# Patient Record
Sex: Male | Born: 1990 | Race: White | Hispanic: No | Marital: Single | Smoking: Never smoker
Health system: Southern US, Community
[De-identification: ages and names within clinical notes are randomized; demographics above are authoritative.]

## PROBLEM LIST (undated history)

## (undated) DIAGNOSIS — F909 Attention-deficit hyperactivity disorder, unspecified type: Secondary | ICD-10-CM

## (undated) DIAGNOSIS — F419 Anxiety disorder, unspecified: Secondary | ICD-10-CM

## (undated) DIAGNOSIS — F32A Depression, unspecified: Secondary | ICD-10-CM

## (undated) DIAGNOSIS — F329 Major depressive disorder, single episode, unspecified: Secondary | ICD-10-CM

## (undated) DIAGNOSIS — F319 Bipolar disorder, unspecified: Secondary | ICD-10-CM

## (undated) HISTORY — PX: ADENOIDECTOMY: SUR15

## (undated) HISTORY — PX: TONSILLECTOMY: SUR1361

---
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
## 2010-01-15 ENCOUNTER — Emergency Department (HOSPITAL_COMMUNITY)
Admission: EM | Admit: 2010-01-15 | Discharge: 2010-01-16 | Payer: Self-pay | Source: Home / Self Care | Admitting: Emergency Medicine

## 2010-02-11 ENCOUNTER — Encounter: Payer: Self-pay | Admitting: Preventative Medicine

## 2010-02-20 NOTE — Letter (Signed)
Summary: Unable to Reach, Consult Scheduled  [facility]  [address]   [place], [place] [address]   Phone: [phone]  Fax: [phone]    06/16/2009  [facility] [address] [place], [place]  [address] 04-25-1990   Dear Shane Patrick,   We have been unable to reach you by phone.  Please contact our office with an updated phone number.  At the recommendation of DR Effie Shy we have been asked to schedule you a consult with DR Jena Gauss for DYSPHAGIA.    Please call our office at [phone].     Thank you,    Diana Eves  [facility] R. Roetta Sessions, M.D.    Jonette Eva, M.D. Lorenza Burton, FNP-BC    Tana Coast, PA-C Phone: [phone]    Fax: [phone]

## 2010-02-24 ENCOUNTER — Emergency Department (HOSPITAL_COMMUNITY)
Admission: EM | Admit: 2010-02-24 | Discharge: 2010-02-24 | Disposition: A | Discharge status: Home / Self Care | Payer: Medicaid Other | Attending: Emergency Medicine | Admitting: Emergency Medicine

## 2010-02-24 DIAGNOSIS — L259 Unspecified contact dermatitis, unspecified cause: Secondary | ICD-10-CM | POA: Insufficient documentation

## 2010-02-24 DIAGNOSIS — K644 Residual hemorrhoidal skin tags: Secondary | ICD-10-CM | POA: Insufficient documentation

## 2010-04-02 LAB — CBC
HCT: 46.8 % (ref 39.0–52.0)
Hemoglobin: 15.9 g/dL (ref 13.0–17.0)
MCH: 29.7 pg (ref 26.0–34.0)
MCHC: 34 g/dL (ref 30.0–36.0)
MCV: 87.5 fL (ref 78.0–100.0)
Platelets: 287 10*3/uL (ref 150–400)
RBC: 5.35 MIL/uL (ref 4.22–5.81)
RDW: 12.9 % (ref 11.5–15.5)
WBC: 9.8 10*3/uL (ref 4.0–10.5)

## 2010-04-02 LAB — DIFFERENTIAL
Basophils Absolute: 0 10*3/uL (ref 0.0–0.1)
Basophils Relative: 0 % (ref 0–1)
Eosinophils Absolute: 0.2 10*3/uL (ref 0.0–0.7)
Eosinophils Relative: 2 % (ref 0–5)
Lymphocytes Relative: 32 % (ref 12–46)
Lymphs Abs: 3.1 10*3/uL (ref 0.7–4.0)
Monocytes Absolute: 1.1 10*3/uL — ABNORMAL HIGH (ref 0.1–1.0)
Monocytes Relative: 11 % (ref 3–12)
Neutro Abs: 5.4 10*3/uL (ref 1.7–7.7)
Neutrophils Relative %: 55 % (ref 43–77)

## 2010-04-02 LAB — BASIC METABOLIC PANEL
BUN: 10 mg/dL (ref 6–23)
CO2: 28 mEq/L (ref 19–32)
Calcium: 9.9 mg/dL (ref 8.4–10.5)
Chloride: 102 mEq/L (ref 96–112)
Creatinine, Ser: 0.94 mg/dL (ref 0.4–1.5)
GFR calc Af Amer: >60 mL/min (ref >60)
GFR calc non Af Amer: >60 mL/min (ref >60)
Glucose, Bld: 80 mg/dL (ref 70–99)
Potassium: 3.4 mEq/L — ABNORMAL LOW (ref 3.5–5.1)
Sodium: 140 mEq/L (ref 135–145)

## 2010-05-01 LAB — COMPREHENSIVE METABOLIC PANEL
ALT: 16 U/L (ref 0–53)
AST: 17 U/L (ref 0–37)
Albumin: 4.3 g/dL (ref 3.5–5.2)
Alkaline Phosphatase: 111 U/L (ref 39–117)
BUN: 9 mg/dL (ref 6–23)
CO2: 30 mEq/L (ref 19–32)
Calcium: 9.9 mg/dL (ref 8.4–10.5)
Chloride: 101 mEq/L (ref 96–112)
Creatinine, Ser: 0.83 mg/dL (ref 0.4–1.5)
GFR calc Af Amer: >60 mL/min (ref >60)
GFR calc non Af Amer: >60 mL/min (ref >60)
Glucose, Bld: 90 mg/dL (ref 70–99)
Potassium: 3.8 mEq/L (ref 3.5–5.1)
Sodium: 138 mEq/L (ref 135–145)
Total Bilirubin: 0.6 mg/dL (ref 0.3–1.2)
Total Protein: 8.1 g/dL (ref 6.0–8.3)

## 2010-05-01 LAB — CBC
HCT: 47 % (ref 39.0–52.0)
Hemoglobin: 16.3 g/dL (ref 13.0–17.0)
MCHC: 34.6 g/dL (ref 30.0–36.0)
MCV: 88.2 fL (ref 78.0–100.0)
Platelets: 289 10*3/uL (ref 150–400)
RBC: 5.33 MIL/uL (ref 4.22–5.81)
RDW: 12.7 % (ref 11.5–15.5)
WBC: 9.8 10*3/uL (ref 4.0–10.5)

## 2010-05-01 LAB — DIFFERENTIAL
Basophils Absolute: 0 10*3/uL (ref 0.0–0.1)
Basophils Relative: 0 % (ref 0–1)
Eosinophils Absolute: 0.2 10*3/uL (ref 0.0–0.7)
Eosinophils Relative: 2 % (ref 0–5)
Lymphocytes Relative: 23 % (ref 12–46)
Lymphs Abs: 2.3 10*3/uL (ref 0.7–4.0)
Monocytes Absolute: 0.9 10*3/uL (ref 0.1–1.0)
Monocytes Relative: 9 % (ref 3–12)
Neutro Abs: 6.4 10*3/uL (ref 1.7–7.7)
Neutrophils Relative %: 65 % (ref 43–77)

## 2010-05-01 LAB — URINALYSIS, ROUTINE W REFLEX MICROSCOPIC
Bilirubin Urine: NEGATIVE
Glucose, UA: NEGATIVE mg/dL
Hgb urine dipstick: NEGATIVE
Ketones, ur: NEGATIVE mg/dL
Nitrite: NEGATIVE
Protein, ur: NEGATIVE mg/dL
Specific Gravity, Urine: 1.02 (ref 1.005–1.030)
Urobilinogen, UA: 0.2 mg/dL (ref 0.0–1.0)
pH: 6 (ref 5.0–8.0)

## 2010-05-01 LAB — LIPASE, BLOOD: Lipase: 14 U/L (ref 11–59)

## 2010-10-02 ENCOUNTER — Emergency Department (HOSPITAL_COMMUNITY)
Admission: EM | Admit: 2010-10-02 | Discharge: 2010-10-02 | Disposition: A | Discharge status: Home / Self Care | Payer: Medicaid Other | Attending: Emergency Medicine | Admitting: Emergency Medicine

## 2010-10-02 ENCOUNTER — Emergency Department (HOSPITAL_COMMUNITY): Payer: Medicaid Other

## 2010-10-02 ENCOUNTER — Encounter: Payer: Self-pay | Admitting: Emergency Medicine

## 2010-10-02 DIAGNOSIS — S335XXA Sprain of ligaments of lumbar spine, initial encounter: Secondary | ICD-10-CM | POA: Insufficient documentation

## 2010-10-02 DIAGNOSIS — X58XXXA Exposure to other specified factors, initial encounter: Secondary | ICD-10-CM | POA: Insufficient documentation

## 2010-10-02 DIAGNOSIS — M545 Low back pain, unspecified: Secondary | ICD-10-CM

## 2010-10-02 MED ORDER — IBUPROFEN 600 MG PO TABS: 600.0000 mg | ORAL_TABLET | Freq: Three times a day (TID) | ORAL | Status: AC | PRN

## 2010-10-02 MED ORDER — IBUPROFEN 800 MG PO TABS
800.0000 mg | ORAL_TABLET | Freq: Once | ORAL | Status: AC
  Administered 2010-10-02: 800 mg via ORAL
  Filled 2010-10-02: qty 1

## 2010-10-02 NOTE — ED Provider Notes (Signed)
Medical screening examination/treatment/procedure(s) were performed by non-physician practitioner and as supervising physician I was immediately available for consultation/collaboration. Gee Habig, MD, FACEP   Osa Campoli L Nayara Taplin, MD 10/02/10 2344 

## 2010-10-02 NOTE — ED Notes (Signed)
Pt c/o lower back pain x 1 month. Denies gi/gu.

## 2010-10-02 NOTE — ED Notes (Signed)
Pt a/ox4. Resp even and unlabored. NAD at this time. D/C instructions reviewed with pt. Pt verbalized understanding. Pt ambulated to lobby with steady gate.  

## 2010-10-02 NOTE — ED Provider Notes (Signed)
History     CSN: [id number] Arrival date & time: 10/02/2010  6:16 PM  Chief Complaint  Patient presents with  . Back Pain   Patient is a 20 y.o. male presenting with back pain. The history is provided by the patient and a parent.  Back Pain  This is a chronic problem. The current episode started more than 1 week ago (He describes near chronic low back pain and soreness for the past month.  He denies injury.). Episode frequency: chronic pain with movement and forward flexion.  Pain resolves at rest. The problem has not changed since onset.The pain is associated with no known injury. The pain is present in the lumbar spine. The quality of the pain is described as aching. The pain does not radiate. The pain is at a severity of 9/10. The pain is severe. The symptoms are aggravated by certain positions and bending. The pain is worse during the day. Pertinent negatives include no chest pain, no fever, no numbness, no headaches, no abdominal pain, no dysuria and no weakness.    History reviewed. No pertinent past medical history.  History reviewed. No pertinent past surgical history.  No family history on file.  History  Substance Use Topics  . Smoking status: Never Smoker   . Smokeless tobacco: Not on file  . Alcohol Use: No      Review of Systems  Constitutional: Negative for fever.  HENT: Negative for congestion, sore throat and neck pain.   Eyes: Negative.   Respiratory: Negative for chest tightness and shortness of breath.   Cardiovascular: Negative for chest pain.  Gastrointestinal: Negative for nausea and abdominal pain.  Genitourinary: Negative.  Negative for dysuria, urgency and hematuria.  Musculoskeletal: Positive for back pain. Negative for joint swelling and arthralgias.  Skin: Negative.  Negative for rash and wound.  Neurological: Negative for dizziness, weakness, light-headedness, numbness and headaches.  Hematological: Negative.   Psychiatric/Behavioral: Negative.      Physical Exam  BP 130/75  Pulse 85  Temp(Src) 99.1 F (37.3 C) (Oral)  Resp 20  Ht 6\' 4"  (1.93 m)  Wt 236 lb (107.049 kg)  BMI 28.73 kg/m2  SpO2 100%  Physical Exam  Constitutional: He is oriented to person, place, and time. He appears well-developed and well-nourished.  HENT:  Head: Normocephalic.  Eyes: Conjunctivae are normal.  Neck: Normal range of motion. Neck supple.  Cardiovascular: Regular rhythm and intact distal pulses.        Pedal pulses normal.  Pulmonary/Chest: Effort normal. He has no wheezes.  Abdominal: Soft. Bowel sounds are normal. He exhibits no distension and no mass.  Musculoskeletal: Normal range of motion. He exhibits tenderness. He exhibits no edema.       Lumbar back: He exhibits tenderness. He exhibits no swelling, no edema and no spasm.  Neurological: He is alert and oriented to person, place, and time. He has normal strength. He displays no atrophy and no tremor. No cranial nerve deficit or sensory deficit. Gait normal.  Reflex Scores:      Patellar reflexes are 2+ on the right side and 2+ on the left side.      Achilles reflexes are 2+ on the right side and 2+ on the left side.      No strength deficit noted in hip and knee flexor and extensor muscle groups.  Ankle flexion and extension intact.  Skin: Skin is warm and dry.  Psychiatric: He has a normal mood and affect.    ED Course  Procedures  MDM Lumbar strain with normal xray.      Candis Musa, PA 10/02/10 1959

## 2014-07-03 ENCOUNTER — Encounter (HOSPITAL_COMMUNITY): Payer: Self-pay

## 2014-07-03 ENCOUNTER — Emergency Department (HOSPITAL_COMMUNITY)
Admission: EM | Admit: 2014-07-03 | Discharge: 2014-07-04 | Disposition: A | Discharge status: Other Acute Inpatient Hospital | Payer: Self-pay | Attending: Emergency Medicine | Admitting: Emergency Medicine

## 2014-07-03 ENCOUNTER — Emergency Department (HOSPITAL_COMMUNITY): Payer: Medicaid Other

## 2014-07-03 DIAGNOSIS — Z79899 Other long term (current) drug therapy: Secondary | ICD-10-CM | POA: Insufficient documentation

## 2014-07-03 DIAGNOSIS — S61411A Laceration without foreign body of right hand, initial encounter: Secondary | ICD-10-CM | POA: Insufficient documentation

## 2014-07-03 DIAGNOSIS — F329 Major depressive disorder, single episode, unspecified: Secondary | ICD-10-CM | POA: Diagnosis present

## 2014-07-03 DIAGNOSIS — Z23 Encounter for immunization: Secondary | ICD-10-CM | POA: Insufficient documentation

## 2014-07-03 DIAGNOSIS — Y998 Other external cause status: Secondary | ICD-10-CM | POA: Insufficient documentation

## 2014-07-03 DIAGNOSIS — Y92008 Other place in unspecified non-institutional (private) residence as the place of occurrence of the external cause: Secondary | ICD-10-CM | POA: Insufficient documentation

## 2014-07-03 DIAGNOSIS — R456 Violent behavior: Secondary | ICD-10-CM

## 2014-07-03 DIAGNOSIS — Z880 Allergy status to penicillin: Secondary | ICD-10-CM | POA: Insufficient documentation

## 2014-07-03 DIAGNOSIS — F919 Conduct disorder, unspecified: Secondary | ICD-10-CM

## 2014-07-03 DIAGNOSIS — IMO0002 Reserved for concepts with insufficient information to code with codable children: Secondary | ICD-10-CM

## 2014-07-03 DIAGNOSIS — Z720 Tobacco use: Secondary | ICD-10-CM | POA: Insufficient documentation

## 2014-07-03 DIAGNOSIS — F69 Unspecified disorder of adult personality and behavior: Secondary | ICD-10-CM

## 2014-07-03 DIAGNOSIS — Y9389 Activity, other specified: Secondary | ICD-10-CM | POA: Insufficient documentation

## 2014-07-03 DIAGNOSIS — X838XXA Intentional self-harm by other specified means, initial encounter: Secondary | ICD-10-CM

## 2014-07-03 DIAGNOSIS — W231XXA Caught, crushed, jammed, or pinched between stationary objects, initial encounter: Secondary | ICD-10-CM | POA: Insufficient documentation

## 2014-07-03 HISTORY — DX: Bipolar disorder, unspecified: F31.9

## 2014-07-03 HISTORY — DX: Attention-deficit hyperactivity disorder, unspecified type: F90.9

## 2014-07-03 LAB — COMPREHENSIVE METABOLIC PANEL
ALBUMIN: 5 g/dL (ref 3.5–5.0)
ALT: 19 U/L (ref 17–63)
ANION GAP: 12 (ref 5–15)
AST: 19 U/L (ref 15–41)
Alkaline Phosphatase: 81 U/L (ref 38–126)
BILIRUBIN TOTAL: 0.9 mg/dL (ref 0.3–1.2)
BUN: 15 mg/dL (ref 6–20)
CALCIUM: 9.8 mg/dL (ref 8.9–10.3)
CO2: 26 mmol/L (ref 22–32)
CREATININE: 0.79 mg/dL (ref 0.61–1.24)
Chloride: 101 mmol/L (ref 101–111)
GFR calc Af Amer: >60 mL/min (ref >60)
GFR calc non Af Amer: >60 mL/min (ref >60)
Glucose, Bld: 82 mg/dL (ref 65–99)
Potassium: 4 mmol/L (ref 3.5–5.1)
Sodium: 139 mmol/L (ref 135–145)
Total Protein: 8.5 g/dL — ABNORMAL HIGH (ref 6.5–8.1)

## 2014-07-03 LAB — CBC WITH DIFFERENTIAL/PLATELET
BASOS PCT: 0 % (ref 0–1)
Basophils Absolute: 0 10*3/uL (ref 0.0–0.1)
EOS ABS: 0.2 10*3/uL (ref 0.0–0.7)
Eosinophils Relative: 2 % (ref 0–5)
HCT: 47.5 % (ref 39.0–52.0)
Hemoglobin: 15.5 g/dL (ref 13.0–17.0)
LYMPHS PCT: 26 % (ref 12–46)
Lymphs Abs: 2.9 10*3/uL (ref 0.7–4.0)
MCH: 30.3 pg (ref 26.0–34.0)
MCHC: 32.6 g/dL (ref 30.0–36.0)
MCV: 92.8 fL (ref 78.0–100.0)
MONOS PCT: 8 % (ref 3–12)
Monocytes Absolute: 0.9 10*3/uL (ref 0.1–1.0)
NEUTROS ABS: 7.2 10*3/uL (ref 1.7–7.7)
Neutrophils Relative %: 64 % (ref 43–77)
Platelets: 274 10*3/uL (ref 150–400)
RBC: 5.12 MIL/uL (ref 4.22–5.81)
RDW: 13 % (ref 11.5–15.5)
WBC: 11.2 10*3/uL — ABNORMAL HIGH (ref 4.0–10.5)

## 2014-07-03 LAB — RAPID URINE DRUG SCREEN, HOSP PERFORMED
Amphetamines: NOT DETECTED
BARBITURATES: NOT DETECTED
BENZODIAZEPINES: NOT DETECTED
Cocaine: NOT DETECTED
OPIATES: NOT DETECTED
TETRAHYDROCANNABINOL: NOT DETECTED

## 2014-07-03 LAB — SALICYLATE LEVEL: Salicylate Lvl: <4 mg/dL (ref 2.8–30.0)

## 2014-07-03 LAB — ETHANOL: Alcohol, Ethyl (B): <5 mg/dL (ref <5)

## 2014-07-03 LAB — ACETAMINOPHEN LEVEL: Acetaminophen (Tylenol), Serum: <10 ug/mL — ABNORMAL LOW (ref 10–30)

## 2014-07-03 MED ORDER — IBUPROFEN 200 MG PO TABS: 600.0000 mg | ORAL_TABLET | Freq: Three times a day (TID) | ORAL | Status: DC | PRN

## 2014-07-03 MED ORDER — LIDOCAINE HCL (PF) 1 % IJ SOLN: 10.0000 mL | Freq: Once | INTRAMUSCULAR | Status: DC

## 2014-07-03 MED ORDER — TETANUS-DIPHTH-ACELL PERTUSSIS 5-2.5-18.5 LF-MCG/0.5 IM SUSP
0.5000 mL | Freq: Once | INTRAMUSCULAR | Status: AC
  Administered 2014-07-03: 0.5 mL via INTRAMUSCULAR
  Filled 2014-07-03: qty 0.5

## 2014-07-03 MED ORDER — ONDANSETRON HCL 4 MG PO TABS
4.0000 mg | ORAL_TABLET | Freq: Three times a day (TID) | ORAL | Status: DC | PRN
Start: 2014-07-03 — End: 2014-07-04

## 2014-07-03 MED ORDER — ACETAMINOPHEN 325 MG PO TABS
650.0000 mg | ORAL_TABLET | ORAL | Status: DC | PRN
  Administered 2014-07-04: 650 mg via ORAL
  Filled 2014-07-03: qty 2

## 2014-07-03 MED ORDER — ZOLPIDEM TARTRATE 5 MG PO TABS
5.0000 mg | ORAL_TABLET | Freq: Every evening | ORAL | Status: DC | PRN
  Administered 2014-07-03: 5 mg via ORAL
  Filled 2014-07-03: qty 1

## 2014-07-03 MED ORDER — LORAZEPAM 1 MG PO TABS
1.0000 mg | ORAL_TABLET | Freq: Three times a day (TID) | ORAL | Status: DC | PRN
  Administered 2014-07-03: 1 mg via ORAL
  Filled 2014-07-03: qty 1
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
## 2014-07-03 NOTE — BH Assessment (Signed)
Assessment completed. Consulted Alberteen Sam, NP who recommended inpatient treatment. Arthor Captain, PA-C has been informed of the recommendation. Spoke with pt and his mother about treatment recommendation. Pt inquired about the ability to shave and have make up while in treatment. This Clinical research associate informed pt that he will receive further details once he has been assigned a bed at a facility. TTS will contact [facility] for bed availability.

## 2014-07-03 NOTE — ED Notes (Signed)
Pt states that he feels anxious and worried and that this has been going on for 2 months and today his boyfriend broke up with him, pt is tearful.  Pt states that he doesn't want to harm himself or anyone else but that he does get very angry at times and has hit himself during such an episode

## 2014-07-03 NOTE — ED Notes (Signed)
Pt has been seen and wanded by security.  Pt's belongings are with family.

## 2014-07-03 NOTE — ED Notes (Signed)
Attempted to drawn labs from this patient. This writer stuck the patient twice with no success.

## 2014-07-03 NOTE — ED Notes (Signed)
TTS in with patient, unable to obtain labs at this time.
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
## 2014-07-04 ENCOUNTER — Encounter (HOSPITAL_COMMUNITY): Payer: Self-pay | Admitting: *Deleted

## 2014-07-04 ENCOUNTER — Inpatient Hospital Stay (HOSPITAL_COMMUNITY)
Admission: AD | Admit: 2014-07-04 | Discharge: 2014-07-08 | DRG: 885 | Disposition: A | Discharge status: Home / Self Care | Payer: 59 | Source: Intra-hospital | Attending: Psychiatry | Admitting: Psychiatry

## 2014-07-04 DIAGNOSIS — F6381 Intermittent explosive disorder: Secondary | ICD-10-CM | POA: Diagnosis present

## 2014-07-04 DIAGNOSIS — F332 Major depressive disorder, recurrent severe without psychotic features: Secondary | ICD-10-CM | POA: Diagnosis present

## 2014-07-04 DIAGNOSIS — F411 Generalized anxiety disorder: Secondary | ICD-10-CM | POA: Diagnosis present

## 2014-07-04 DIAGNOSIS — R45851 Suicidal ideations: Secondary | ICD-10-CM | POA: Diagnosis present

## 2014-07-04 DIAGNOSIS — F17210 Nicotine dependence, cigarettes, uncomplicated: Secondary | ICD-10-CM | POA: Diagnosis present

## 2014-07-04 DIAGNOSIS — F329 Major depressive disorder, single episode, unspecified: Secondary | ICD-10-CM | POA: Diagnosis present

## 2014-07-04 DIAGNOSIS — F419 Anxiety disorder, unspecified: Secondary | ICD-10-CM | POA: Diagnosis not present

## 2014-07-04 DIAGNOSIS — R456 Violent behavior: Secondary | ICD-10-CM | POA: Diagnosis present

## 2014-07-04 DIAGNOSIS — IMO0002 Reserved for concepts with insufficient information to code with codable children: Secondary | ICD-10-CM | POA: Insufficient documentation

## 2014-07-04 DIAGNOSIS — R45850 Homicidal ideations: Secondary | ICD-10-CM

## 2014-07-04 MED ORDER — HYDROXYZINE HCL 25 MG PO TABS
25.0000 mg | ORAL_TABLET | Freq: Four times a day (QID) | ORAL | Status: DC | PRN
  Administered 2014-07-04 – 2014-07-07 (×4): 25 mg via ORAL
  Filled 2014-07-04 (×4): qty 1

## 2014-07-04 MED ORDER — ALUM & MAG HYDROXIDE-SIMETH 200-200-20 MG/5ML PO SUSP: 30.0000 mL | ORAL | Status: DC | PRN

## 2014-07-04 MED ORDER — MAGNESIUM HYDROXIDE 400 MG/5ML PO SUSP: 30.0000 mL | Freq: Every day | ORAL | Status: DC | PRN

## 2014-07-04 MED ORDER — TRAZODONE HCL 50 MG PO TABS
50.0000 mg | ORAL_TABLET | Freq: Every evening | ORAL | Status: DC | PRN
  Filled 2014-07-04: qty 14
  Filled 2014-07-04: qty 1
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
## 2014-07-04 NOTE — BH Assessment (Signed)
[facility] Assessment Progress Note  Per Thedore Mins, MD this pt requires psychiatric hospitalization.  Thurman Coyer, RN, [facility], has assigned pt to [facility] Rm 407-1.  IVC paperwork has been faxed to [facility] and pt's nurse has been notified.  She agrees to call report to [phone].  Pt is to be transported via [facility].  Doylene Canning, MA Triage Specialist [phone]

## 2014-07-04 NOTE — Progress Notes (Signed)
Did not attend group [place]

## 2014-07-04 NOTE — Progress Notes (Signed)
Pt given list of [place] un insured/free clinics - 6 page list Pt confirmed no family doctor
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
## 2014-07-05 ENCOUNTER — Encounter (HOSPITAL_COMMUNITY): Payer: Self-pay | Admitting: Psychiatry

## 2014-07-05 DIAGNOSIS — F419 Anxiety disorder, unspecified: Secondary | ICD-10-CM

## 2014-07-05 DIAGNOSIS — F332 Major depressive disorder, recurrent severe without psychotic features: Principal | ICD-10-CM

## 2014-07-05 DIAGNOSIS — F411 Generalized anxiety disorder: Secondary | ICD-10-CM

## 2014-07-05 DIAGNOSIS — F6381 Intermittent explosive disorder: Secondary | ICD-10-CM

## 2014-07-05 MED ORDER — DIVALPROEX SODIUM ER 500 MG PO TB24
500.0000 mg | ORAL_TABLET | Freq: Every day | ORAL | Status: DC
  Administered 2014-07-05 – 2014-07-06 (×2): 500 mg via ORAL
  Filled 2014-07-05 (×4): qty 1

## 2014-07-05 MED ORDER — CITALOPRAM HYDROBROMIDE 20 MG PO TABS
20.0000 mg | ORAL_TABLET | Freq: Every day | ORAL | Status: DC
Start: 2014-07-05 — End: 2014-07-08
  Administered 2014-07-05 – 2014-07-08 (×4): 20 mg via ORAL
  Filled 2014-07-05: qty 14
  Filled 2014-07-05 (×6): qty 1

## 2014-07-05 NOTE — Progress Notes (Signed)
Patient ID: Shane Patrick, male   DOB: 08-30-1990, 24 y.o.   MRN: [id number] Completed self inventory and gave self a 4 on feelings of depression and an 8 for feelings of anxiety. His goal is to go home and to be less depressed and angry.

## 2014-07-05 NOTE — Progress Notes (Signed)
Recreation Therapy Notes  Animal-Assisted Activity (AAA) Program Checklist/Progress Notes Patient Eligibility Criteria Checklist & Daily Group note for Rec Tx Intervention  Date: 06.14.16 Time: 2:30 pm Location: [address]   AAA/T Program Assumption of Risk Form signed by Patient/ or Parent Legal Guardian yes  Patient is free of allergies or sever asthma yes  Patient reports no fear of animals yes  Patient reports no history of cruelty to animals yes  Patient understands his/her participation is voluntary yes  Patient washes hands before animal contact yes  Patient washes hands after animal contact yes  Behavioral Response: Engaged  Education: Charity fundraiser, Appropriate Animal Interaction   Education Outcome: Acknowledges understanding/In group clarification offered/Needs additional education.   Clinical Observations/Feedback:  Patient attended group.   Caroll Rancher, LRT/CTRS         Caroll Rancher A 07/05/2014 3:54 PM

## 2014-07-05 NOTE — [facility] Suicide Risk Assessment (Signed)
[facility] INPATIENT:  Family/Significant Other Suicide Prevention Education  Suicide Prevention Education:  Education Completed; Kaiyden Aftab, Mother - [phone];  has been identified by the patient as the family member/significant other with whom the patient will be residing, and identified as the person(s) who will aid the patient in the event of a mental health crisis (suicidal ideations/suicide attempt).  With written consent from the patient, the family member/significant other has been provided the following suicide prevention education, prior to the and/or following the discharge of the patient.  Mother advised of being aware of SPE  The suicide prevention education provided includes the following:  Suicide risk factors  Suicide prevention and interventions  National Suicide Hotline telephone number  [facility] assessment telephone number  [facility] Emergency Assistance 911  [facility] and/or Residential Mobile Crisis Unit telephone number  Request made of family/significant other to:  Remove weapons (e.g., guns, rifles, knives), all items previously/currently identified as safety concern.  Mother advised patient does not have access to weapons.    Remove drugs/medications (over-the-counter, prescriptions, illicit drugs), all items previously/currently identified as a safety concern.  The family member/significant other verbalizes understanding of the suicide prevention education information provided.  The family member/significant other agrees to remove the items of safety concern listed above.  Wynn Banker 07/05/2014, 1:05 PM
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
## 2014-07-05 NOTE — Progress Notes (Signed)
Patient ID: Shane Patrick, male   DOB: 23-Jun-1990, 24 y.o.   MRN: [id number] D-In bed after breakfast and when spoken to says he is just very tired. He does report he slept well last HS with [facility]. He has no complaints at this time. A-Encouraged to get out among his peers and be active on the unit. He no longer is as uncomfortable on the unit per his report and while he wants to go home he is OK for now. R-Remains in bed asleep. No problems voiced.

## 2014-07-05 NOTE — Tx Team (Signed)
Initial Interdisciplinary Treatment Plan   PATIENT STRESSORS: Financial difficulties Marital or family conflict Occupational concerns   PATIENT STRENGTHS: Ability for insight Average or above average intelligence Capable of independent living General fund of knowledge Motivation for treatment/growth Supportive family/friends   PROBLEM LIST: Problem List/Patient Goals Date to be addressed Date deferred Reason deferred Estimated date of resolution  Depression      Conflict/break up with boyfriend      Risk for self harm      Explosive anger issues      Grief-grandfather passed away a week ago      "I need other ways to express myself when I am under a lot of stress; I can't keep living like this"                         DISCHARGE CRITERIA:  Ability to meet basic life and health needs Improved stabilization in mood, thinking, and/or behavior Motivation to continue treatment in a less acute level of care Need for constant or close observation no longer present Reduction of life-threatening or endangering symptoms to within safe limits Verbal commitment to aftercare and medication compliance  PRELIMINARY DISCHARGE PLAN: Attend aftercare/continuing care group Outpatient therapy Return to previous living arrangement  PATIENT/FAMIILY INVOLVEMENT: This treatment plan has been presented to and reviewed with the patient, Shane Patrick, and/or family member.  The patient and family have been given the opportunity to ask questions and make suggestions.  Jesus Genera [facility] 07/05/2014, 4:35 AM
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
## 2014-07-05 NOTE — Progress Notes (Signed)
[facility] Group Notes:  (Nursing/MHT/Case Management/Adjunct)  Date:  07/05/2014  Time:  9:23 PM   Summary of Progress/Problems: Did not attend group.  Madaline Savage 07/05/2014, 9:23 PM

## 2014-07-05 NOTE — [facility] Group Notes (Signed)
[facility] LCSW Group Therapy      Feelings About Diagnosis 1:15 - 2:30 PM         07/05/2014  2:49 PM    Type of Therapy:  Group Therapy  Participation Level:  Active  Participation Quality:  Appropriate  Affect:  Appropriate  Cognitive:  Alert and Appropriate  Insight:  Developing/Improving and Engaged  Engagement in Therapy:  Developing/Improving and Engaged  Modes of Intervention:  Discussion, Education, Exploration, Problem-Solving, Rapport Building, Support  Summary of Progress/Problems:  Patient actively participated in group. Patient discussed past and present diagnosis and the effects it has had on  life.  Patient talked about family and society being judgmental and the stigma associated with having a mental health diagnosis. Patient shared he has just found himself so he is working to accept having a mental health diagnosis.  Wynn Banker 07/05/2014  2:49 PM
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
## 2014-07-06 LAB — HIV ANTIBODY (ROUTINE TESTING W REFLEX): HIV Screen 4th Generation wRfx: NONREACTIVE

## 2014-07-06 MED ORDER — ONDANSETRON 4 MG PO TBDP: 4.0000 mg | ORAL_TABLET | Freq: Three times a day (TID) | ORAL | Status: DC | PRN

## 2014-07-06 NOTE — Progress Notes (Signed)
Recreation Therapy Notes  Date: 06.15.16 Time: 9:30 am Location: [address] Hall Group Room  Group Topic: Stress Management  Goal Area(s) Addresses:  Patient will verbalize importance of using healthy stress management.  Patient will identify positive emotions associated with healthy stress management.   Intervention: Stress Management  Activity :  Guided Imagery.  LRT introduced and educated patients on stress management technique of guided imagery.  A script was used to deliver the technique to patients.  Patients were asked to follow the script read aloud by LRT to engage in practicing the technique.  Education:  Stress Management, Discharge Planning.   Clinical Observations/Feedback: Patient did not attend group.  Jaleal Schliep, LRT/CTRS         Danton Palmateer A 07/06/2014 2:12 PM 

## 2014-07-06 NOTE — Progress Notes (Signed)
D: Pt denies SI/HI/AVH. Pt is pleasant and cooperative. Pt stated he wanted to work on his anger. Pt wants to "better his self". Pt stated he wanted to continue to take his medications when he leaves.   A: Pt was offered support and encouragement. Pt was given scheduled medications. Pt was encourage to attend groups. Q 15 minute checks were done for safety.   R:Pt  interacts well with peers and staff. Pt is taking medication. Pt has no complaints at this time .Pt receptive to treatment and safety maintained on unit.

## 2014-07-06 NOTE — [facility] Group Notes (Signed)
[facility] LCSW Aftercare Discharge Planning Group Note   07/06/2014 11:03 AM    Participation Quality:  Appropraite  Mood/Affect:  Depressed, Flat  Depression Rating:  0  Anxiety Rating:  7  Thoughts of Suicide:  No  Will you contract for safety?   NA  Current AVH:  No  Plan for Discharge/Comments:  Patient attended discharge planning group and actively participated in group.  Patient reports he has been followed by [facility] but does not want to return their for services.  He will be referred to [facility].  Suicide prevention education reviewed and SPE document provided.   Transportation Means: Patient has transportation.   Supports:  Patient has a support system.   Shane Patrick, Joesph July
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
## 2014-07-06 NOTE — Progress Notes (Signed)
Patient ID: Shane Patrick, male   DOB: 01-Feb-1990, 24 y.o.   MRN: [id number] D: Patient denies SI/HI and auditory and visual hallucinations. Patient stated he is not depressed today. Rates anxiety 9 on 1 to 10 scale. Affect depressed. Patient reported some lightheadedness and headaches today on inventory but did not request medication. Patient set goal to help himself to get better and to get out of here.  A: Patient given emotional support from RN. Patient given medications per MD orders. Patient encouraged to attend groups and unit activities. Patient encouraged to come to staff with any questions or concerns.  R: Patient remains cooperative and appropriate. States he is working hard to get better. Will continue to monitor patient for safety.

## 2014-07-06 NOTE — Progress Notes (Signed)
Adult Psychoeducational Group Note  Date:  07/06/2014 Time:  9:23 PM  Group Topic/Focus:  Wrap-Up Group:   The focus of this group is to help patients review their daily goal of treatment and discuss progress on daily workbooks.  Participation Level:  Minimal  Participation Quality:  Appropriate  Affect:  Appropriate  Cognitive:  Alert  Insight: Appropriate  Engagement in Group:  Engaged  Modes of Intervention:  Discussion  Additional Comments:  Pt stated that he is excited about being able to go home Friday.   Kaleen Odea R 07/06/2014, 9:23 PM

## 2014-07-06 NOTE — [facility] Group Notes (Signed)
[facility] LCSW Group Therapy  07/06/2014 3:10 PM  Type of Therapy:  Group Therapy  Participation Level:  Did Not Attend - Patient asked to come to group but did not attend.  Wynn Banker 07/06/2014, 3:10 PM

## 2014-07-07 MED ORDER — DIVALPROEX SODIUM ER 250 MG PO TB24
750.0000 mg | ORAL_TABLET | Freq: Every day | ORAL | Status: DC
  Administered 2014-07-07: 750 mg via ORAL
  Filled 2014-07-07: qty 3
  Filled 2014-07-07: qty 42
  Filled 2014-07-07: qty 3

## 2014-07-07 NOTE — Progress Notes (Signed)
D: Pt denies SI/HI/AVH. Pt is pleasant and cooperative. Pt stated he felt a little better today, he found out he was leaving on Friday. Pt observed interacting on the unit with peers.   A: Pt was offered support and encouragement. Pt was given scheduled medications. Pt was encourage to attend groups. Q 15 minute checks were done for safety.    R:Pt attends groups and interacts well with peers and staff. Pt is taking medication. Pt has no complaints at this time .Pt receptive to treatment and safety maintained on unit.

## 2014-07-07 NOTE — [facility] Group Notes (Signed)
[facility] LCSW Group Therapy  [facility] [facility] [employer] [facility] 1:15 - 2:30 PM  07/07/2014 2:34 PM   Type of Therapy:  Group Therapy  Participation Level:  Appropriate  Participation Quality:  Attentive  Affect:  Appropriate  Cognitive:  Appropriate  Insight:  Developing/Improving   Engagement in Therapy:  Developing/Improving   Modes of Intervention:  Discussion, Education, Exploration, Problem-Solving, Rapport Building, Support   Summary of Progress/Problems:   Patient was attentive to speaker from the [employer] as he shared his story of dealing with mental health/substance abuse issues and overcoming it by working a recovery program.  Patient received information on their agency.    Wynn Banker 07/07/2014 2:34 PM

## 2014-07-07 NOTE — Plan of Care (Signed)
Problem: Ineffective individual coping Goal: STG:Pt. will utilize relaxation techniques to reduce stress STG: Patient will utilize relaxation techniques to reduce stress levels  Outcome: Progressing Pt stated she was to smile all day at people and that helped pt feel good all day  Problem: Alteration in mood Goal: LTG-Patient reports reduction in suicidal thoughts (Patient reports reduction in suicidal thoughts and is able to verbalize a safety plan for whenever patient is feeling suicidal)  Outcome: Progressing Pt denies SI at this time

## 2014-07-07 NOTE — Progress Notes (Addendum)
Patient ID: Shane Patrick, male   DOB: 1990/10/10, 24 y.o.   MRN: [id number] [facility] MD Progress Note  07/07/2014 8:33 AM PHILIPPE GANG  MRN:  [id number] Subjective:   Patient had reported some nausea and  Light headedness possibly related to Depakote trial, yesterday he had also reported subjective sense of sedation, now improved- at this time these symptoms have resolved and currently denies medication side effects. He states he has been feeling much better insofar as his mood, and feels calmer. He states " the medications are really helping, I don't feel myself getting angry any more". He does report ongoing anxiety, which he rates as 7/10, and which at this time he attributes to relationship issues with BF. States he has realized that boyfriend tends to be controlling and states " it also bothers me a lot that he smokes pot all the time". States he has made decision of possibly breaking up, is conflicted about this, anxious , but states " I do think it is the best thing for me to do , I need to focus on myself".   Objective:  I've discussed case with treatment team and have met with patient. On unit he has been calm, pleasant, interactive with peers. He has been visible on unit. As noted, no discrete anger or explosive episodes noted or reported on unit. States he had a visit from brother last night which was unexpected, did feel anxious, but states he feels he has good family support. As he improves, he is becoming more future oriented, today spoke about plans to return to work soon, and concern about whether being away from work during this past week may jeopardize his job. Seemed reassured by offer to provide him with a letter documenting inpatient admission dates at discharge. He is tolerating Depakote ER and Celexa well- nausea now resolved. HIV test negative    Principal Problem: MDD (major depressive disorder) Diagnosis:   Patient Active Problem List   Diagnosis Date Noted  . 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 GAD (generalized anxiety disorder) [F41.1] 07/05/2014  . MDD (major depressive disorder) [F32.2] 07/04/2014  . Laceration [T14.8]   . Self-harm [name]]   . Violent behavior [R45.6]    Total Time spent with patient: 25 minutes   Past Medical History:  Past Medical History  Diagnosis Date  . ADHD (attention deficit hyperactivity disorder)   . Bipolar 1 disorder     Past Surgical History  Procedure Laterality Date  . Tonsillectomy    . Adenoidectomy     Family History: History reviewed. No pertinent family history. Social History:  History  Alcohol Use No    Comment: former alcohol use.     History  Drug Use No    History   Social History  . Marital Status: Single    Spouse Name: N/A  . Number of Children: N/A  . Years of Education: N/A   Social History Main Topics  . Smoking status: Current Every Day Smoker  . Smokeless tobacco: Never Used  . Alcohol Use: No     Comment: former alcohol use.  . Drug Use: No  . Sexual Activity: Not on file   Other Topics Concern  . None   Social History Narrative   Additional History:    Sleep: Good  Appetite:  Good   Assessment:   Musculoskeletal: Strength & Muscle Tone: within normal limits Gait & Station: normal Patient leans: N/A   Psychiatric Specialty Exam: Physical Exam  ROS  Nausea improved, no vomiting, no abdominal pain, sedation has resolved   Blood pressure 150/68, pulse 102, temperature 98.4 F (36.9 C), temperature source Oral, resp. rate 16.There is no weight on file to calculate BMI.  General Appearance: improved grooming  Eye Contact::  Good  Speech:  Normal Rate  Volume:  Normal  Mood:  improving, still somewhat depressed, anxious   Affect:  Appropriate- no anger outbursts, remains anxious   Thought Process:  Linear  Orientation:  Full (Time, Place, and Person)  Thought Content:  no hallucinations, no delusions  Suicidal Thoughts:  No at this  time, contracts for safety on unit.  Homicidal Thoughts:  No specifically also denies any thoughts of wanting to harm boyfriend or any family member  Memory:  recent and remote, grossly intact  Judgement:  Fair  Insight:  improving  Psychomotor Activity:  Normal has had no episodes of restlessness or agitation  Concentration:  Good  Recall:  Good  Fund of Knowledge:Good  Language: Good  Akathisia:  Negative  Handed:  Right  AIMS (if indicated):     Assets:  Communication Skills Desire for Improvement Physical Health  ADL's:  fair  Cognition: WNL  Sleep:  Number of Hours: 6     Current Medications: Current Facility-Administered Medications  Medication Dose Route Frequency Provider Last Rate Last Dose  . alum & mag hydroxide-simeth (MAALOX/MYLANTA) 200-200-20 MG/5ML suspension 30 mL  30 mL Oral Q4H PRN Benjamine Mola, FNP      . citalopram (CELEXA) tablet 20 mg  20 mg Oral Daily Jenne Campus, MD   20 mg at 07/06/14 5638  . divalproex (DEPAKOTE ER) 24 hr tablet 500 mg  500 mg Oral QHS Myer Peer Cobos, MD   500 mg at 07/06/14 2220  . hydrOXYzine (ATARAX/VISTARIL) tablet 25 mg  25 mg Oral Q6H PRN Laverle Hobby, PA-C   25 mg at 07/06/14 2219  . magnesium hydroxide (MILK OF MAGNESIA) suspension 30 mL  30 mL Oral Daily PRN Benjamine Mola, FNP      . ondansetron (ZOFRAN-ODT) disintegrating tablet 4 mg  4 mg Oral Q8H PRN Laverle Hobby, PA-C      . traZODone (DESYREL) tablet 50 mg  50 mg Oral QHS PRN Niel Hummer, NP        Lab Results:  Results for orders placed or performed during the hospital encounter of 07/04/14 (from the past 48 hour(s))  HIV antibody     Status: None   Collection Time: 07/06/14  6:43 AM  Result Value Ref Range   HIV Screen 4th Generation wRfx Non Reactive Non Reactive    Comment: (NOTE) Performed At: [facility] [place], [place] [id number] Lindon Romp MD OT:[id number] Performed at [facility]      Physical Findings: AIMS: Facial and Oral Movements Muscles of Facial Expression: None, normal Lips and Perioral Area: None, normal Jaw: None, normal Tongue: None, normal,Extremity Movements Upper (arms, wrists, hands, fingers): None, normal Lower (legs, knees, ankles, toes): None, normal, Trunk Movements Neck, shoulders, hips: None, normal, Overall Severity Severity of abnormal movements (highest score from questions above): None, normal, Dental Status Current problems with teeth and/or dentures?: No Does patient usually wear dentures?: No  CIWA:    COWS:      Assessment:  Patient presents with improvement - he feels medications have helped mood and improved anger/explosiveness control. He has had no episodes of explosive anger on unit. He  had been experiencing some vague medication side effects such as nausea and sedation. These side effects now reported as improved. He does remain anxious, but more future oriented, ruminates about his relationship with boyfriend, and is currently thinking of breaking up, which he admits makes him anxious. States he is better able now to focus on himself and his own improvement . Agrees to titrating Depakote dose further- titration slow due to initial side effects. He is not suicidal or homicidal or psychotic .   Treatment Plan Summary: Daily contact with patient to assess and evaluate symptoms and progress in treatment, Medication management, Plan continue inpatient treatment and continue medications as below Increase  Depakote ER  To 750  mgs QHS for mood and to help control explosiveness. Continue Celexa 20 mg QDAY to address mood/depression. Continue Trazodone 50 mgs QHS for insomnia as needed.  Continue Vistaril PRN for anxiety if needed.  Ordered Valproic Acid Serum level, CBC, LFTS ( routine as started on Depakote recently)  CSW working on disposition planning- patient intends to return home after discharge.  Medical Decision Making:   Established Problem, Stable/Improving (1), Review of Psycho-Social Stressors (1), Review or order clinical lab tests (1) and Review of New Medication or Change in Dosage (2)     COBOS, FERNANDO 07/07/2014, 8:33 AM

## 2014-07-07 NOTE — Progress Notes (Signed)
D: Pt denies SI/HI/AVH. Pt is pleasant and cooperative. Pt continues to be excited about going home. Pt continues to be very visible on the milieu.   A: Pt was offered support and encouragement. Pt was given scheduled medications. Pt was encourage to attend groups. Q 15 minute checks were done for safety.   R:Pt attends groups and interacts well with peers and staff. Pt is taking medication. Pt has no complaints at this time .Pt receptive to treatment and safety maintained on unit.

## 2014-07-07 NOTE — Progress Notes (Signed)
Patient ID: Shane Patrick, male   DOB: [birth year], 24 y.o.   MRN: [id number] Adult Psychoeducational Group Note  Date:  07/07/2014 Time: 09:15am  Group Topic/Focus:  Orientation:   The focus of this group is to educate the patient on the purpose and policies of crisis stabilization and provide a format to answer questions about their admission.  The group details unit policies and expectations of patients while admitted.  Participation Level:  Active  Participation Quality:  Appropriate and Attentive  Affect:  Excited  Cognitive:  Alert and Oriented  Insight: Good  Engagement in Group:  Improving  Modes of Intervention:  Discussion, Education, Orientation and Support  Additional Comments:  Pt able to identify one daily goal. "I want to share my happiness and smile with others today, I am just really happy."  Aurora Mask 07/07/2014, 10:39 AM

## 2014-07-07 NOTE — [facility] Group Notes (Signed)
Patient attended karaoke group.  

## 2014-07-07 NOTE — Plan of Care (Signed)
Problem: Ineffective individual coping Goal: LTG: Patient will report a decrease in negative feelings Outcome: Completed/Met Date Met:  07/07/14 Pt reports anxiety/depression at a 0. Pt also states "I want to share my happiness and smile with others today."

## 2014-07-07 NOTE — Progress Notes (Signed)
Patient ID: Shane Patrick, male   DOB: 1990/07/02, 24 y.o.   MRN: [id number]  Pt currently presents with a animated affect and elated behavior. Per self inventory, pt rates depression, hopelessness and anxiety at a 0. Pt's daily goal is to "keep working on getting out Friday and keep taking my meds" and they intend to do so by "go to groups." Pt reports fair sleep, good concentration, normal energy and a fair appetite. Pt also states in group that "I want to share my happiness and smile with others today."  Pt provided with medications per providers orders. Pt's labs and vitals were monitored throughout the day. Pt supported emotionally and encouraged to express concerns and questions. Pt educated on medications.   Pt's safety ensured with 15 minute and environmental checks. Pt currently denies SI/HI and A/V hallucinations. Pt verbally agrees to seek staff if SI/HI or A/VH occurs and to consult with staff before acting on these thoughts. Will continue POC. Pt attends group and interacts with other peers in a positive manner.

## 2014-07-07 NOTE — Plan of Care (Signed)
Problem: Diagnosis: Increased Risk For Suicide Attempt Goal: LTG-Patient Will Report Improved Mood and Deny Suicidal LTG (by discharge) Patient will report improved mood and deny suicidal ideation.  Outcome: Progressing Pt stated he was feeling better today than he felt yesterday and denied SI at this time

## 2014-07-08 DIAGNOSIS — F332 Major depressive disorder, recurrent severe without psychotic features: Secondary | ICD-10-CM | POA: Insufficient documentation

## 2014-07-08 LAB — CBC WITH DIFFERENTIAL/PLATELET
Basophils Absolute: 0 10*3/uL (ref 0.0–0.1)
Basophils Relative: 0 % (ref 0–1)
EOS PCT: 2 % (ref 0–5)
Eosinophils Absolute: 0.2 10*3/uL (ref 0.0–0.7)
HEMATOCRIT: 46.6 % (ref 39.0–52.0)
Hemoglobin: 15.4 g/dL (ref 13.0–17.0)
LYMPHS ABS: 3.1 10*3/uL (ref 0.7–4.0)
LYMPHS PCT: 41 % (ref 12–46)
MCH: 30.3 pg (ref 26.0–34.0)
MCHC: 33 g/dL (ref 30.0–36.0)
MCV: 91.6 fL (ref 78.0–100.0)
MONO ABS: 0.7 10*3/uL (ref 0.1–1.0)
Monocytes Relative: 9 % (ref 3–12)
NEUTROS ABS: 3.7 10*3/uL (ref 1.7–7.7)
Neutrophils Relative %: 48 % (ref 43–77)
Platelets: 286 10*3/uL (ref 150–400)
RBC: 5.09 MIL/uL (ref 4.22–5.81)
RDW: 12.8 % (ref 11.5–15.5)
WBC: 7.7 10*3/uL (ref 4.0–10.5)

## 2014-07-08 LAB — HEPATIC FUNCTION PANEL
ALT: 20 U/L (ref 17–63)
AST: 17 U/L (ref 15–41)
Albumin: 4.4 g/dL (ref 3.5–5.0)
Alkaline Phosphatase: 77 U/L (ref 38–126)
BILIRUBIN TOTAL: 0.9 mg/dL (ref 0.3–1.2)
Bilirubin, Direct: 0.1 mg/dL (ref 0.1–0.5)
Indirect Bilirubin: 0.8 mg/dL (ref 0.3–0.9)
Total Protein: 8.1 g/dL (ref 6.5–8.1)

## 2014-07-08 LAB — VALPROIC ACID LEVEL: Valproic Acid Lvl: 51 ug/mL (ref 50.0–100.0)

## 2014-07-08 MED ORDER — TRAZODONE HCL 50 MG PO TABS: 50.0000 mg | ORAL_TABLET | Freq: Every evening | ORAL | Status: DC | PRN

## 2014-07-08 MED ORDER — DIVALPROEX SODIUM ER 250 MG PO TB24: 750.0000 mg | ORAL_TABLET | Freq: Every day | ORAL | Status: DC

## 2014-07-08 MED ORDER — CITALOPRAM HYDROBROMIDE 20 MG PO TABS: 20.0000 mg | ORAL_TABLET | Freq: Every day | ORAL | Status: DC

## 2014-07-08 NOTE — Progress Notes (Signed)
Discharge note: Patient discharged per MD order. Discharge information reviewed with patient. Patient verbalizes understanding of discharge information and follow up appointment. RX and sample medication given. Medication regimen reviewed with patient. Patient denies SI/HI at discharge. Perfume (spray) returned to patient from locker #57, patient signed and verbalized that he received all items. Ambulatory out of facility. Mother and grandmother in lobby for discharge. Discharge information/medication regimen reviewed with them as well (consent signed). Family appears to be involved in patients care.

## 2014-07-08 NOTE — Progress Notes (Signed)
D:Per patient self inventory form patient reports fair sleep without the use of sleep medication. Reports a fair appetite, normal energy level, and good concentration. Rates depression 0/10, hopelessness 0/10, anxiety 5/10; all on 0-10 scale- 10 being the worse. Denies physical pain. Denies SI/HI. "Im excited for discharge, I already have my things packed." Behavior cooperative.   A: Special checks q 15 mins in place for safety. Medication administered per MD order (see eMAR). Encouragement and counseling provided.    R: Pt compliant with medication regimen, safety maintained.
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
## 2014-07-08 NOTE — Progress Notes (Signed)
  [facility] Adult Case Management Discharge Plan :  Will you be returning to the same living situation after discharge:  Yes,  Patient is returning home with family At discharge, do you have transportation home?: Yes,  Patient will arrange transportation. Do you have the ability to pay for your medications: No, patient will be assisted with indigent medications.   Release of information consent forms completed and in the chart;  Patient's signature needed at discharge.  Patient to Follow up at: Follow-up Information    Follow up with [facility] On 07/19/2014.   Why:  Tuesday, July 19, 2014 at 8:30 AM   Contact information:   [address] [place], [place]   [address]  [phone]`      Patient denies SI/HI: Patient no longer endorsing SI/HI or other thoughts of self harm.  Safety Planning and Suicide Prevention discussed:.Reviewed with all patients during discharge planning group   Have you used any form of tobacco in the last 30 days? (Cigarettes, Smokeless Tobacco, Cigars, and/or Pipes): No  Has patient been referred to the Quitline?:No referral needed to quitline.   Wynn Banker 07/08/2014, 11:37 AM

## 2014-07-08 NOTE — [facility] Suicide Risk Assessment (Signed)
[facility] Discharge Suicide Risk Assessment   Demographic Factors:  24 year old  Single male, no children, employed   Total Time spent with patient: 30 minutes   Musculoskeletal: Strength & Muscle Tone: within normal limits Gait & Station: normal Patient leans: N/A  Psychiatric Specialty Exam: Physical Exam  ROS  Blood pressure 124/73, pulse 85, temperature 97.7 F (36.5 C), temperature source Oral, resp. rate 16.There is no weight on file to calculate BMI.  General Appearance: improved grooming  Eye Contact::  Good  Speech:  Normal Rate409  Volume:  Normal  Mood:  improved, euthymic at this time, no anger or explosiveness noted or reported   Affect:  reactive, appropriate, calm, pleasant  Thought Process:  Goal Directed and Linear  Orientation:  Full (Time, Place, and Person)  Thought Content:  no hallucinations, no delusions  Suicidal Thoughts:  No at this time denies any thoughts of hurting self or anyone else  Homicidal Thoughts:  No  Memory:  recent and remote grossly intact   Judgement:  Good  Insight:  improved  Psychomotor Activity:  Normal no agitated or explosive episodes on unit  Concentration:  Good  Recall:  Good  Fund of Knowledge:Good  Language: Good  Akathisia:  Negative  Handed:  Right  AIMS (if indicated):     Assets:  Housing Physical Health Resilience Vocational/Educational  Sleep:  Number of Hours: 6.25  Cognition: WNL  ADL's:  improved   Have you used any form of tobacco in the last 30 days? (Cigarettes, Smokeless Tobacco, Cigars, and/or Pipes): No  Has this patient used any form of tobacco in the last 30 days? (Cigarettes, Smokeless Tobacco, Cigars, and/or Pipes) No  Mental Status Per Nursing Assessment::   On Admission:  Self-harm behaviors  Current Mental Status by Physician: At this time patient is fully alert and attentive, well related, pleasant, cooperative, calm, describes mood as improved and currently normal, no SI/HI, no psychotic  symptoms, future oriented.  Loss Factors: Relationship stressors  Historical Factors: History of depression and history of intermittent explosiveness  Risk Reduction Factors:   Sense of responsibility to family, Employed, Living with another person, especially a relative, Positive social support and Positive coping skills or problem solving skills  Continued Clinical Symptoms:  As noted, patient much improved compared to admission, has remained calm and without explosiveness or significant anger. Has denied any SI or any HI. Has denied any thoughts of hurting significant other or anyone else. There have been no psychotic symptoms. Thus far has tolerated medications (to include Depakote). Of note, repeat CBC and LFTs WNL. Valproic acid level 51.  Cognitive Features That Contribute To Risk:  No gross cognitive deficits noted upon discharge. Is alert , attentive, and oriented x 3   Suicide Risk:  Mild:  Suicidal ideation of limited frequency, intensity, duration, and specificity.  There are no identifiable plans, no associated intent, mild dysphoria and related symptoms, good self-control (both objective and subjective assessment), few other risk factors, and identifiable protective factors, including available and accessible social support.  Principal Problem: MDD (major depressive disorder) Discharge Diagnoses:  Patient Active Problem List   Diagnosis Date Noted  . Intermittent explosive disorder [F63.81] 07/05/2014  . GAD (generalized anxiety disorder) [F41.1] 07/05/2014  . MDD (major depressive disorder) [F32.2] 07/04/2014  . Laceration [T14.8]   . Self-harm Gideon.Lares.8XXA]   . Violent behavior [R45.6]     Follow-up Information    Follow up with [facility] On 07/19/2014.   Why:  Tuesday, July 19, 2014 at 8:30 AM   Contact information:   [address] [place], [place]   [address]  [phone]`      Plan Of Care/Follow-up recommendations:  Activity:  as tolerated Diet:   regular Tests:  NA Other:  see below  Is patient on multiple antipsychotic therapies at discharge:  No   Has Patient had three or more failed trials of antipsychotic monotherapy by history:  No  Recommended Plan for Multiple Antipsychotic Therapies: NA  Patient is leaving in good spirits. Plans to follow up with [facility] as above.  Plans to return home.   COBOS, FERNANDO 07/08/2014, 8:55 AM

## 2014-07-08 NOTE — Progress Notes (Signed)
Recreation Therapy Notes  Date: 06.17.16 Time: 09:30 am Location: [address] Group Room  Group Topic: Stress Management  Goal Area(s) Addresses:  Patient will verbalize importance of using healthy stress management.  Patient will identify positive emotions associated with healthy stress management.   Intervention: Stress Management  Activity :  Progressive Muscle Relaxation.  LRT introduced and educated patients on stress management technique of progressive muscle relaxation.  A script was used to deliver the technique to patients.  Patients were asked to follow script read a loud by LRT to engage in the stress management technique.  Education:  Stress Management, Discharge Planning.   Clinical Observations/Feedback: Patient did not attend group.    Caroll Rancher, LRT/CTRS         Lillia Abed, Abagael Kramm A 07/08/2014 1:24 PM

## 2014-07-08 NOTE — [facility] Group Notes (Signed)
[facility] LCSW Aftercare Discharge Planning Group Note   07/08/2014 2:53 PM    Participation Quality:  Appropraite  Mood/Affect:  Appropriate  Depression Rating:  2  Anxiety Rating:  5  Thoughts of Suicide:  No  Will you contract for safety?   NA  Current AVH:  No  Plan for Discharge/Comments:  Patient attended discharge planning group and actively participated in group. He reports being much better and looks forward to discharge today.  He will follow up with [facility].Suicide prevention education reviewed and SPE document provided.   Transportation Means: Patient has transportation.   Supports:  Patient has a support system.   Gayla Benn, Joesph July

## 2014-07-08 NOTE — Tx Team (Signed)
Interdisciplinary Treatment Plan Update (Adult)  Date:  07/08/2014  Time Reviewed:  9:32 AM   Progress in Treatment: Attending groups: Patient is attending groups. Participating in groups:  Patient engages in discussion Taking medication as prescribed:  Patient is taking medications Tolerating medication:  Patient is tolerating medications Family/Significant othe contact made:   Yes, collateral contact with mother. Patient understands diagnosis:Yes, patient understands diagnosis and need for treatment Discussing patient identified problems/goals with staff:  Yes, patient is able to express goals/problems Medical problems stabilized or resolved:  Yes Denies suicidal/homicidal ideation: Yes, patient is denying SI/HI. Issues/concerns per patient self-inventory:   Other:  Discharge Plan or Barriers:  [facility]  Reason for Continuation of Hospitalization:  Comments:     Additional comments:  Patient and CSW reviewed Patient Discharge Process Letter/Patient Involvement Form.  Patient verbalized understanding and signed form.  Patient and CSW also reviewed and identified patient's goals and treatment plan.  Patient verbalized understanding and agreed to plan.  Estimated length of stay:     Discharge today  New goal(s):  Review of initial/current patient goals per problem list:  Please see plan of careInterdisciplinary Treatment Plan Update (Adult)  Attendees: Patient 07/08/2014 9:32 AM   Family:   07/08/2014 9:32 AM   Physician:  Nehemiah Massed, MD 07/08/2014 9:32 AM   Nursing:   Vivi Ferns, RN 07/08/2014 9:32 AM   Clinical Social Worker:  Juline Patch, LCSW 07/08/2014 9:32 AM   Clinical Social Worker:  Belenda Cruise Drinkard, LCSW-A 07/08/2014 9:32 AM   Case Manager:  Onnie Boer, RN 07/08/2014 9:32 AM   Other:  Genelle Gather, Rn 07/08/2014 9:32 AM  Other:   07/08/2014  9:32 AM   Other:  07/08/2014 9:32 AM   Other:  07/08/2014 9:32 AM   Other:  07/08/2014 9:32 AM   Other:   Chad Cordial Transition Team Coordinator 07/08/2014 9:32 AM   Other:   07/08/2014 9:32 AM   Other:  07/08/2014 9:32 AM   Other:   07/08/2014 9:32 AM    Scribe for Treatment Team:   Wynn Banker, 07/08/2014   9:32 AM

## 2014-08-06 ENCOUNTER — Inpatient Hospital Stay (HOSPITAL_COMMUNITY)
Admission: AD | Admit: 2014-08-06 | Discharge: 2014-08-08 | DRG: 885 | Disposition: A | Discharge status: Home / Self Care | Payer: No Typology Code available for payment source | Attending: Psychiatry | Admitting: Psychiatry

## 2014-08-06 ENCOUNTER — Ambulatory Visit (HOSPITAL_COMMUNITY)
Admission: AD | Admit: 2014-08-06 | Discharge: 2014-08-06 | Disposition: A | Discharge status: Home / Self Care | Payer: No Typology Code available for payment source | Source: Home / Self Care | Attending: Psychiatry | Admitting: Psychiatry

## 2014-08-06 ENCOUNTER — Emergency Department (HOSPITAL_COMMUNITY)
Admission: EM | Admit: 2014-08-06 | Discharge: 2014-08-06 | Disposition: A | Discharge status: Other Acute Inpatient Hospital | Payer: Medicaid Other | Attending: Emergency Medicine | Admitting: Emergency Medicine

## 2014-08-06 ENCOUNTER — Encounter (HOSPITAL_COMMUNITY): Payer: Self-pay | Admitting: *Deleted

## 2014-08-06 ENCOUNTER — Encounter (HOSPITAL_COMMUNITY): Payer: Self-pay | Admitting: Oncology

## 2014-08-06 DIAGNOSIS — R45851 Suicidal ideations: Secondary | ICD-10-CM | POA: Diagnosis present

## 2014-08-06 DIAGNOSIS — F411 Generalized anxiety disorder: Secondary | ICD-10-CM | POA: Diagnosis present

## 2014-08-06 DIAGNOSIS — Z720 Tobacco use: Secondary | ICD-10-CM | POA: Insufficient documentation

## 2014-08-06 DIAGNOSIS — Z880 Allergy status to penicillin: Secondary | ICD-10-CM | POA: Diagnosis not present

## 2014-08-06 DIAGNOSIS — F909 Attention-deficit hyperactivity disorder, unspecified type: Secondary | ICD-10-CM | POA: Diagnosis present

## 2014-08-06 DIAGNOSIS — F17200 Nicotine dependence, unspecified, uncomplicated: Secondary | ICD-10-CM | POA: Diagnosis present

## 2014-08-06 DIAGNOSIS — F332 Major depressive disorder, recurrent severe without psychotic features: Secondary | ICD-10-CM | POA: Diagnosis present

## 2014-08-06 DIAGNOSIS — Z79899 Other long term (current) drug therapy: Secondary | ICD-10-CM | POA: Diagnosis not present

## 2014-08-06 DIAGNOSIS — F339 Major depressive disorder, recurrent, unspecified: Secondary | ICD-10-CM | POA: Diagnosis present

## 2014-08-06 DIAGNOSIS — F1310 Sedative, hypnotic or anxiolytic abuse, uncomplicated: Secondary | ICD-10-CM | POA: Insufficient documentation

## 2014-08-06 HISTORY — DX: Major depressive disorder, single episode, unspecified: F32.9

## 2014-08-06 HISTORY — DX: Depression, unspecified: F32.A

## 2014-08-06 LAB — RAPID URINE DRUG SCREEN, HOSP PERFORMED
AMPHETAMINES: NOT DETECTED
BARBITURATES: NOT DETECTED
BENZODIAZEPINES: POSITIVE — AB
COCAINE: NOT DETECTED
Opiates: NOT DETECTED
Tetrahydrocannabinol: NOT DETECTED

## 2014-08-06 LAB — CBC
HCT: 44.6 % (ref 39.0–52.0)
Hemoglobin: 14.2 g/dL (ref 13.0–17.0)
MCH: 29.8 pg (ref 26.0–34.0)
MCHC: 31.8 g/dL (ref 30.0–36.0)
MCV: 93.5 fL (ref 78.0–100.0)
PLATELETS: 236 10*3/uL (ref 150–400)
RBC: 4.77 MIL/uL (ref 4.22–5.81)
RDW: 13 % (ref 11.5–15.5)
WBC: 8.5 10*3/uL (ref 4.0–10.5)

## 2014-08-06 LAB — COMPREHENSIVE METABOLIC PANEL
ALT: 15 U/L — ABNORMAL LOW (ref 17–63)
ANION GAP: 10 (ref 5–15)
AST: 16 U/L (ref 15–41)
Albumin: 4.2 g/dL (ref 3.5–5.0)
Alkaline Phosphatase: 70 U/L (ref 38–126)
BILIRUBIN TOTAL: 0.5 mg/dL (ref 0.3–1.2)
BUN: 12 mg/dL (ref 6–20)
CHLORIDE: 104 mmol/L (ref 101–111)
CO2: 27 mmol/L (ref 22–32)
CREATININE: 0.81 mg/dL (ref 0.61–1.24)
Calcium: 9.4 mg/dL (ref 8.9–10.3)
GFR calc Af Amer: >60 mL/min (ref >60)
Glucose, Bld: 100 mg/dL — ABNORMAL HIGH (ref 65–99)
Potassium: 3.9 mmol/L (ref 3.5–5.1)
Sodium: 141 mmol/L (ref 135–145)
Total Protein: 7.5 g/dL (ref 6.5–8.1)

## 2014-08-06 LAB — RPR: RPR Ser Ql: NONREACTIVE

## 2014-08-06 LAB — SALICYLATE LEVEL: Salicylate Lvl: <4 mg/dL (ref 2.8–30.0)

## 2014-08-06 LAB — ETHANOL: Alcohol, Ethyl (B): <5 mg/dL (ref <5)

## 2014-08-06 LAB — ACETAMINOPHEN LEVEL

## 2014-08-06 LAB — HIV ANTIBODY (ROUTINE TESTING W REFLEX): HIV SCREEN 4TH GENERATION: NONREACTIVE

## 2014-08-06 MED ORDER — ACETAMINOPHEN 325 MG PO TABS
650.0000 mg | ORAL_TABLET | Freq: Four times a day (QID) | ORAL | Status: DC | PRN
  Administered 2014-08-07 (×2): 650 mg via ORAL
  Filled 2014-08-06 (×2): qty 2

## 2014-08-06 MED ORDER — ACETAMINOPHEN 325 MG PO TABS: 650.0000 mg | ORAL_TABLET | ORAL | Status: DC | PRN

## 2014-08-06 MED ORDER — ALUM & MAG HYDROXIDE-SIMETH 200-200-20 MG/5ML PO SUSP: 30.0000 mL | ORAL | Status: DC | PRN

## 2014-08-06 MED ORDER — MAGNESIUM HYDROXIDE 400 MG/5ML PO SUSP: 30.0000 mL | Freq: Every day | ORAL | Status: DC | PRN

## 2014-08-06 MED ORDER — ONDANSETRON HCL 4 MG PO TABS: 4.0000 mg | ORAL_TABLET | Freq: Three times a day (TID) | ORAL | Status: DC | PRN

## 2014-08-06 MED ORDER — CITALOPRAM HYDROBROMIDE 20 MG PO TABS
20.0000 mg | ORAL_TABLET | Freq: Every day | ORAL | Status: DC
  Administered 2014-08-07 – 2014-08-08 (×2): 20 mg via ORAL
  Filled 2014-08-06 (×2): qty 1
  Filled 2014-08-06: qty 3
  Filled 2014-08-06: qty 1

## 2014-08-06 MED ORDER — IBUPROFEN 200 MG PO TABS: 600.0000 mg | ORAL_TABLET | Freq: Three times a day (TID) | ORAL | Status: DC | PRN

## 2014-08-06 MED ORDER — NICOTINE 21 MG/24HR TD PT24: 21.0000 mg | MEDICATED_PATCH | Freq: Every day | TRANSDERMAL | Status: DC

## 2014-08-06 MED ORDER — DIVALPROEX SODIUM ER 500 MG PO TB24
750.0000 mg | ORAL_TABLET | Freq: Every day | ORAL | Status: DC
  Administered 2014-08-06: 750 mg via ORAL
  Filled 2014-08-06 (×5): qty 1

## 2014-08-06 MED ORDER — TRAZODONE HCL 50 MG PO TABS
50.0000 mg | ORAL_TABLET | Freq: Every evening | ORAL | Status: DC | PRN
  Filled 2014-08-06: qty 3
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
## 2014-08-06 NOTE — Progress Notes (Signed)
Patient did attend the evening speaker AA meeting.  

## 2014-08-06 NOTE — ED Notes (Signed)
[facility].

## 2014-08-06 NOTE — Progress Notes (Signed)
Patient ID: Shane ChangCody J Patrick, male   DOB: [birth year], 24 y.o.   MRN: [id number]   24 year old Shane Patrick male [facility] reporting that he was having a mental breakdown. Pt reported that he just got out of a relationship, and at first he was having some SI/HI but decided it was not worth it. Pt reported that in the past week he started abusing alcohol and control meds that did not belong to him. Pt reported that he had a couple of drinks, and has been taking 2 1/2 Xanax daily. Pt reported that he has been homeless since the [facility], but just moved back in with his parents. Pt reported his depression as a a 8, his hopelessness as a 10, and his anxiety as a 2. Pt reported being negative SI/HI, and AH/VH at time of admission. Pt is transgender male to male.

## 2014-08-06 NOTE — Progress Notes (Signed)
[facility] to the services of Dr. Dub MikesLugo.   Support paperwork to be completed and faxed over.

## 2014-08-06 NOTE — Progress Notes (Signed)
D: Patient signed a 72 hr release this evening. Patient stated "I really don't think I need this admission. I will like to be discharged". Patient denies pain, SI, AH/VH at this time. Endorses depression and anxiety which she rated 5, 3 respectively. Patient made no new complaint.  A: Support and encouragement offered to patient. Patient encouraged to participate in his treatment plan and verbalize needs to staff. Every 15 minutes check for safety maintained. Will continue to monitor patient for safety and stability. R: Patient remains safe.

## 2014-08-06 NOTE — Consult Note (Signed)
[facility] Face-to-Face Psychiatry Consult   Reason for Consult:  Suicidal thoughts Referring Physician:  ED Provider Patient Identification: Shane Patrick MRN:  [id number] Principal Diagnosis: Major depressive disorder, recurrent, severe without psychotic features Diagnosis:   Patient Active Problem List   Diagnosis Date Noted  . Major depressive disorder, recurrent, severe without psychotic features [F33.2]   . Intermittent explosive disorder [F63.81] 07/05/2014  . GAD (generalized anxiety disorder) [F41.1] 07/05/2014  . MDD (major depressive disorder) [F32.2] 07/04/2014  . Laceration [T14.8]   . Self-harm [name]]   . Violent behavior [R45.6]     Total Time spent with patient: 30 minutes  Subjective:   Shane Patrick is a 24 y.o. male patient admitted with suicidal ideation.  HPI:  Shane Patrick came in to ED wanting to get help.  He was recently treated as inpatient at [facility].  He was unable to get his meds filled due to financial hardship.  And was without meds for 3 weeks.  His meds include Depakote and Trazodone.  He had a recent job loss.  He became frustrated recount taht he had a hard time making ends meet.  He reports almost having his car repossessed.  He is transgender.  He is receptive to treatment as inpatient. HPI Elements:   Location:  Suicidal ideation. Quality:  hopless,  Helpless.  Worthless. Duration:  ongoing. Context:  see HPI.  Past Medical History:  Past Medical History  Diagnosis Date  . ADHD (attention deficit hyperactivity disorder)   . Bipolar 1 disorder   . Depression     Past Surgical History  Procedure Laterality Date  . Tonsillectomy    . Adenoidectomy     Family History: No family history on file. Social History:  History  Alcohol Use  . Yes    Comment: tonight was first use in 1 year per pt     History  Drug Use No    History   Social History  . Marital Status: Single    Spouse Name: N/A  . Number of Children: N/A  . Years of Education: N/A    Social History Main Topics  . Smoking status: Current Every Day Smoker  . Smokeless tobacco: Never Used  . Alcohol Use: Yes     Comment: tonight was first use in 1 year per pt  . Drug Use: No  . Sexual Activity: Not Currently   Other Topics Concern  . None   Social History Narrative   Additional Social History:                          Allergies:   Allergies  Allergen Reactions  . Azithromycin Rash  . Ceclor [Cefaclor] Other (See Comments)    UNKNOWN CHILDHOOD ALLERGY  . Erythromycin Other (See Comments)    UNKNOWN CHILDHOOD ALLERGY  . Penicillins Rash    Labs:  Results for orders placed or performed during the hospital encounter of 08/06/14 (from the past 48 hour(s))  Comprehensive metabolic panel     Status: Abnormal   Collection Time: 08/06/14  5:01 AM  Result Value Ref Range   Sodium 141 135 - 145 mmol/L   Potassium 3.9 3.5 - 5.1 mmol/L   Chloride 104 101 - 111 mmol/L   CO2 27 22 - 32 mmol/L   Glucose, Bld 100 (H) 65 - 99 mg/dL   BUN 12 6 - 20 mg/dL   Creatinine, Ser 0.81 0.61 - 1.24 mg/dL   Calcium 9.4  8.9 - 10.3 mg/dL   Total Protein 7.5 6.5 - 8.1 g/dL   Albumin 4.2 3.5 - 5.0 g/dL   AST 16 15 - 41 U/L   ALT 15 (L) 17 - 63 U/L   Alkaline Phosphatase 70 38 - 126 U/L   Total Bilirubin 0.5 0.3 - 1.2 mg/dL   GFR calc non Af Amer >60 >60 mL/min   GFR calc Af Amer >60 >60 mL/min    Comment: (NOTE) The eGFR has been calculated using the CKD EPI equation. This calculation has not been validated in all clinical situations. eGFR's persistently <60 mL/min signify possible Chronic Kidney Disease.    Anion gap 10 5 - 15  Ethanol (ETOH)     Status: None   Collection Time: 08/06/14  5:01 AM  Result Value Ref Range   Alcohol, Ethyl (B) <5 <5 mg/dL    Comment:        LOWEST DETECTABLE LIMIT FOR SERUM ALCOHOL IS 5 mg/dL FOR MEDICAL PURPOSES ONLY   Salicylate level     Status: None   Collection Time: 08/06/14  5:01 AM  Result Value Ref Range    Salicylate Lvl <7.0 2.8 - 30.0 mg/dL  Acetaminophen level     Status: Abnormal   Collection Time: 08/06/14  5:01 AM  Result Value Ref Range   Acetaminophen (Tylenol), Serum <10 (L) 10 - 30 ug/mL    Comment:        THERAPEUTIC CONCENTRATIONS VARY SIGNIFICANTLY. A RANGE OF 10-30 ug/mL MAY BE AN EFFECTIVE CONCENTRATION FOR MANY PATIENTS. HOWEVER, SOME ARE BEST TREATED AT CONCENTRATIONS OUTSIDE THIS RANGE. ACETAMINOPHEN CONCENTRATIONS >150 ug/mL AT 4 HOURS AFTER INGESTION AND >50 ug/mL AT 12 HOURS AFTER INGESTION ARE OFTEN ASSOCIATED WITH TOXIC REACTIONS.   CBC     Status: None   Collection Time: 08/06/14  5:01 AM  Result Value Ref Range   WBC 8.5 4.0 - 10.5 K/uL   RBC 4.77 4.22 - 5.81 MIL/uL   Hemoglobin 14.2 13.0 - 17.0 g/dL   HCT 44.6 39.0 - 52.0 %   MCV 93.5 78.0 - 100.0 fL   MCH 29.8 26.0 - 34.0 pg   MCHC 31.8 30.0 - 36.0 g/dL   RDW 13.0 11.5 - 15.5 %   Platelets 236 150 - 400 K/uL  RPR     Status: None   Collection Time: 08/06/14  5:01 AM  Result Value Ref Range   RPR Ser Ql Non Reactive Non Reactive    Comment: (NOTE) Performed At: [facility] [address] [place], [place] [id number] Lindon Romp MD [id number]   HIV antibody     Status: None   Collection Time: 08/06/14  5:01 AM  Result Value Ref Range   HIV Screen 4th Generation wRfx Non Reactive Non Reactive    Comment: (NOTE) Performed At: [facility] [place], [place] [id number] Lindon Romp MD [id number]   Urine rapid drug screen (hosp performed) (Not at [facility])     Status: Abnormal   Collection Time: 08/06/14  6:41 AM  Result Value Ref Range   Opiates NONE DETECTED NONE DETECTED   Cocaine NONE DETECTED NONE DETECTED   Benzodiazepines POSITIVE (A) NONE DETECTED   Amphetamines NONE DETECTED NONE DETECTED   Tetrahydrocannabinol NONE DETECTED NONE DETECTED   Barbiturates NONE DETECTED NONE DETECTED    Comment:        DRUG SCREEN FOR MEDICAL PURPOSES ONLY.   IF CONFIRMATION IS NEEDED FOR ANY PURPOSE, NOTIFY LAB WITHIN  5 DAYS.        LOWEST DETECTABLE LIMITS FOR URINE DRUG SCREEN Drug Class       Cutoff (ng/mL) Amphetamine      1000 Barbiturate      200 Benzodiazepine   109 Tricyclics       323 Opiates          300 Cocaine          300 THC              50     Vitals: Blood pressure 113/70, pulse 93, temperature 97.4 F (36.3 C), temperature source Oral, resp. rate 18, height _0  (1.88 m), weight 100.245 kg (221 lb), SpO2 100 %.  Risk to Self: Is patient at risk for suicide?: Yes Risk to Others:   Prior Inpatient Therapy:   Prior Outpatient Therapy:    No current facility-administered medications for this encounter.   No current outpatient prescriptions on file.   Facility-Administered Medications Ordered in Other Encounters  Medication Dose Route Frequency Provider Last Rate Last Dose  . acetaminophen (TYLENOL) tablet 650 mg  650 mg Oral Q6H PRN Encarnacion Slates, NP      . alum & mag hydroxide-simeth (MAALOX/MYLANTA) 200-200-20 MG/5ML suspension 30 mL  30 mL Oral Q4H PRN Encarnacion Slates, NP      . Derrill Memo ON 08/07/2014] citalopram (CELEXA) tablet 20 mg  20 mg Oral Daily Encarnacion Slates, NP      . divalproex (DEPAKOTE ER) 24 hr tablet 750 mg  750 mg Oral QHS Encarnacion Slates, NP      . magnesium hydroxide (MILK OF MAGNESIA) suspension 30 mL  30 mL Oral Daily PRN Encarnacion Slates, NP      . traZODone (DESYREL) tablet 50 mg  50 mg Oral QHS PRN Encarnacion Slates, NP        Musculoskeletal: Strength & Muscle Tone: within normal limits Gait & Station: normal Patient leans: N/A  Psychiatric Specialty Exam: Physical Exam  Vitals reviewed.   Review of Systems  All other systems reviewed and are negative.   Blood pressure 113/70, pulse 93, temperature 97.4 F (36.3 C), temperature source Oral, resp. rate 18, height _1  (1.88 m), weight 100.245 kg (221 lb), SpO2 100 %.Body mass index is 28.36 kg/(m^2).  General Appearance: Casual  Eye  Contact::  Fair  Speech:  Clear and Coherent  Volume:  Normal  Mood:  Anxious and Depressed  Affect:  Depressed  Thought Process:  Circumstantial  Orientation:  Full (Time, Place, and Person)  Thought Content:  Rumination  Suicidal Thoughts:  No  Homicidal Thoughts:  No  Memory:  Immediate;   Fair Recent;   Fair Remote;   Fair  Judgement:  Fair  Insight:  Fair  Psychomotor Activity:  Normal  Concentration:  Good  Recall:  Good  Fund of Knowledge:Good  Language: Good  Akathisia:  Negative  Handed:  Right  AIMS (if indicated):     Assets:  Resilience  ADL's:  Intact  Cognition: WNL  Sleep:      Medical Decision Making: Review of Psycho-Social Stressors (1), Discuss test with performing physician (1), Decision to obtain old records (1), Independent Review of image, tracing or specimen (2) and Review of Medication Regimen & Side Effects (2)  Treatment Plan Summary: Patient meet criteria for acute psychiatric hospitalization secondary to increased symptoms of depression and suicidal ideation secondary to multiple psychosocial stresses and able to contract for safety during  this evaluation. Daily contact with patient to assess and evaluate symptoms and progress in treatment, Medication management and Plan inpatient treatment admit  Plan:  Recommend psychiatric Inpatient admission when medically cleared.  Disposition: admit to inpatient [facility] unit   [facility] AGNP-BC 08/06/2014 7:00 PM  Patient seen face-to-face for this evaluation, case discussed with treatment team and the physician extender and develop treatment plan. Reviewed the information documented and agree with the treatment plan. Emory Gallentine,JANARDHAHA R. 08/07/2014 11:22 AM

## 2014-08-06 NOTE — ED Notes (Signed)
Per pt she is sick of life and endorsing SI w/ plan to use vehicle to complete suicide.  Pt reports multiple life stressors at this time.  ETOH on board.

## 2014-08-06 NOTE — Tx Team (Signed)
Initial Interdisciplinary Treatment Plan   PATIENT STRESSORS: Marital or family conflict   PATIENT STRENGTHS: Ability for insight Average or above average intelligence   PROBLEM LIST: Problem List/Patient Goals Date to be addressed Date deferred Reason deferred Estimated date of resolution  Depression 08/06/14                                                      DISCHARGE CRITERIA:  Ability to meet basic life and health needs Improved stabilization in mood, thinking, and/or behavior  PRELIMINARY DISCHARGE PLAN: Placement in alternative living arrangements  PATIENT/FAMIILY INVOLVEMENT: This treatment plan has been presented to and reviewed with the patient, Billey ChangCody J Pavlik, and/or family member.  The patient and family have been given the opportunity to ask questions and make suggestions.  Jacquelyne BalintForrest, Kemper Heupel Shanta 08/06/2014, 6:12 PM

## 2014-08-06 NOTE — ED Notes (Addendum)
Pt alert and cooperative. Affect/mood sad and depressed. Denies SI/HI/A/V/H @ present but [facility] Emotional support and encouragement given. Will monitor closely and evaluate for stabilization.

## 2014-08-06 NOTE — ED Notes (Signed)
[facility]. All belongings returned.

## 2014-08-07 ENCOUNTER — Encounter (HOSPITAL_COMMUNITY): Payer: Self-pay | Admitting: Psychiatry

## 2014-08-07 DIAGNOSIS — F332 Major depressive disorder, recurrent severe without psychotic features: Principal | ICD-10-CM

## 2014-08-07 MED ORDER — DIVALPROEX SODIUM 250 MG PO DR TAB
250.0000 mg | DELAYED_RELEASE_TABLET | Freq: Every day | ORAL | Status: DC
  Administered 2014-08-07 – 2014-08-08 (×2): 250 mg via ORAL
  Filled 2014-08-07: qty 1
  Filled 2014-08-07: qty 9
  Filled 2014-08-07: qty 1

## 2014-08-07 MED ORDER — DIVALPROEX SODIUM 500 MG PO DR TAB
500.0000 mg | DELAYED_RELEASE_TABLET | Freq: Every day | ORAL | Status: DC
  Administered 2014-08-07: 500 mg via ORAL
  Filled 2014-08-07 (×3): qty 1
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
## 2014-08-07 NOTE — Progress Notes (Signed)
Patient did not attend the evening speaker AA meeting. Pt was notified that group was beginning but remained in the hall.

## 2014-08-07 NOTE — [facility] Group Notes (Signed)
[facility] Group Notes:  (Clinical Social Work)  08/07/2014  10:00-11:00AM  Summary of Progress/Problems:   The main focus of today's process group was to   1)  discuss the importance of adding supports  2)  define health supports versus unhealthy supports  3)  identify the patient's current unhealthy supports and plan how to handle them  4)  Identify the patient's current healthy supports and plan what to add.  An emphasis was placed on using counselor, doctor, therapy groups, 12-step groups, and problem-specific support groups to expand supports.    The patient expressed full comprehension of the concepts presented, and agreed that there is a need to add more supports.  The patient stated that she does not feel it is necessary to "discard" family or friends just because they use drugs or drink, but rather better boundaries could be put in place.  Type of Therapy:  Process Group with Motivational Interviewing  Participation Level:  Active  Participation Quality:  Attentive  Affect:  Anxious and Blunted  Cognitive:  Alert  Insight:  Developing/Improving  Engagement in Therapy:  Engaged  Modes of Intervention:   Education, Support and Processing, Activity  Ambrose MantleMareida Grossman-Orr, LCSW 08/07/2014

## 2014-08-07 NOTE — Plan of Care (Signed)
Problem: Diagnosis: Increased Risk For Suicide Attempt Goal: LTG-Patient Will Report Improved Mood and Deny Suicidal LTG (by discharge) Patient will report improved mood and deny suicidal ideation. Outcome: Progressing Patient denies any depressive symptoms or suicidal ideation.

## 2014-08-07 NOTE — [facility] Suicide Risk Assessment (Signed)
[facility]   Nursing information obtained from:  Patient Demographic factors:  Male, Caucasian Current Mental Status:  NA Loss Factors:  NA Historical Factors:  Prior suicide attempts Risk Reduction Factors:  Sense of responsibility to family Total Time spent with patient: 30 minutes Principal Problem: Major depressive disorder, recurrent, severe without psychotic features Diagnosis:   Patient Active Problem List   Diagnosis Date Noted  . Major depressive disorder, recurrent, severe without psychotic features [F33.2]   . Intermittent explosive disorder [F63.81] 07/05/2014  . GAD (generalized anxiety disorder) [F41.1] 07/05/2014  . MDD (major depressive disorder) [F32.2] 07/04/2014  . Laceration [T14.8]   . Self-harm Gideon.Lares[X83.8XXA]   . Violent behavior [R45.6]      Continued Clinical Symptoms:  Alcohol Use Disorder Identification Test Final Score (AUDIT): 0 The "Alcohol Use Disorders Identification Test", Guidelines for Use in Primary Care, Second Edition.  World Science writerHealth Organization [facility](WHO). Score between 0-7:  no or low risk or alcohol related problems. Score between 8-15:  moderate risk of alcohol related problems. Score between 16-19:  high risk of alcohol related problems. Score 20 or above:  warrants further diagnostic evaluation for alcohol dependence and treatment.   CLINICAL FACTORS:   Depression:   Comorbid alcohol abuse/dependence Alcohol/Substance Abuse/Dependencies   Musculoskeletal: Psychiatric Specialty Exam: Physical Exam  ROS  Blood pressure 137/75, pulse 85, temperature 98 F (36.7 C), temperature source Oral, resp. rate 20, height 6\' 2"  (1.88 m), weight 100.699 kg (222 lb).Body mass index is 28.49 kg/(m^2).   COGNITIVE FEATURES THAT CONTRIBUTE TO RISK:  Closed-mindedness, Polarized thinking and Thought constriction (tunnel vision)    SUICIDE RISK:   Mild:  Suicidal ideation of limited frequency, intensity, duration, and  specificity.  There are no identifiable plans, no associated intent, mild dysphoria and related symptoms, good self-control (both objective and subjective assessment), few other risk factors, and identifiable protective factors, including available and accessible social support.  PLAN OF CARE: Supportive approach/coping skills                               Alcohol/benzodiazepine abuse;work a relapse prevention plan                               Mood instability; resume the Depakote                               Depression resume the Celexa  Medical Decision Making:  Review of Psycho-Social Stressors (1), Review or order clinical lab tests (1), Review of Medication Regimen & Side Effects (2) and Review of New Medication or Change in Dosage (2)  I certify that inpatient services furnished can reasonably be expected to improve the patient's condition.   Keian Odriscoll A 08/07/2014, 7:02 PM
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
## 2014-08-07 NOTE — Progress Notes (Signed)
D: Patient more interactive and engaging. Reports acute headache of 7/10. Offered and accepted Acetaminophen 650 mg PO PRN for pain. Denies SI/HI, AH/VH at this time. Rated depression and anxiety 2/10.  A: Support and encouragement offered to patient. Encouraged patient to verbalize needs to staff. Every 15 minutes check for safety maintained. Will continue to monitor patient for safety and stability. R: Patient remains safe.

## 2014-08-07 NOTE — [facility] Group Notes (Signed)
[facility] Group Notes:  (Nursing/MHT/Case Management/Adjunct)  Date:  08/07/2014  Time: 0900 am  Type of Therapy:  Psychoeducational Skills  Participation Level:  None  Participation Quality:  Attentive  Affect:  Flat  Cognitive:  Lacking  Insight:  Limited  Engagement in Group:  Resistant  Modes of Intervention:  Support  Summary of Progress/Problems: Patient attended group with minimal participation.  She is focused on discharge.  Cranford MonBeaudry, Crescentia Boutwell Evans 08/07/2014, 10:21 AM

## 2014-08-07 NOTE — Progress Notes (Signed)
D: Patient is focused on discharge.  He signed a 72-hour request yesterday evening.  He denies any SI/HI/AVH.  He denies any withdrawal symptoms.  His goal today is "to get help and be out in 72 hours."  He is up in the milieu.  He states he is sleeping poor; appetite good and concentration good.  He rates his depression and anxiety as a 1; hopelessness as a 5.   A: Continue to monitor medication management and MD orders.  Safety checks completed every 15 minutes per protocol.  Offer support and encouragement as needed. R: Patient is interacting well with staff and peers.

## 2014-08-08 DIAGNOSIS — F339 Major depressive disorder, recurrent, unspecified: Secondary | ICD-10-CM | POA: Diagnosis present

## 2014-08-08 LAB — GC/CHLAMYDIA PROBE AMP (~~LOC~~) NOT AT ARMC
Chlamydia: NEGATIVE
Neisseria Gonorrhea: NEGATIVE

## 2014-08-08 LAB — HIV ANTIBODY (ROUTINE TESTING W REFLEX): HIV Screen 4th Generation wRfx: NONREACTIVE

## 2014-08-08 MED ORDER — DIVALPROEX SODIUM 250 MG PO DR TAB: 250.0000 mg | DELAYED_RELEASE_TABLET | Freq: Every day | ORAL | Status: DC

## 2014-08-08 MED ORDER — DIVALPROEX SODIUM 500 MG PO DR TAB: 500.0000 mg | DELAYED_RELEASE_TABLET | Freq: Every day | ORAL | Status: DC

## 2014-08-08 MED ORDER — TRAZODONE HCL 50 MG PO TABS: 50.0000 mg | ORAL_TABLET | Freq: Every evening | ORAL | Status: DC | PRN

## 2014-08-08 MED ORDER — CITALOPRAM HYDROBROMIDE 20 MG PO TABS: 20.0000 mg | ORAL_TABLET | Freq: Every day | ORAL | Status: DC
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
## 2014-08-08 NOTE — Progress Notes (Signed)
Recreation Therapy Notes  Date: 07.18.16 Time: 9:30 am Location: [address] Group Room  Group Topic: Stress Management  Goal Area(s) Addresses:  Patient will verbalize importance of using healthy stress management.  Patient will identify positive emotions associated with healthy stress management.   Intervention: Stress Management  Activity :  Guided Training and development officermagery Script.  LRT introduced and educated patients on the stress management technique of guided imagery.  A script was used to deliver the technique to patients.  Patients were asked to follow the script read a loud by LRT to engage in practicing the stress management technique.  Education:  Stress Management, Discharge Planning.   Education Outcome: Acknowledges edcuation/In group clarification offered/Needs additional education  Clinical Observations/Feedback: Patient did not attend group.   Caroll RancherMarjette Rhondalyn Clingan, LRT/CTRS         Lillia AbedLindsay, Salman Wellen A 08/08/2014 2:10 PM

## 2014-08-08 NOTE — Progress Notes (Signed)
  [facility]  Will you be returning to the same living situation after discharge:  Yes,  Patient plans to return home  At discharge, do you have transportation home?: Yes,  patient reports access to transportation Do you have the ability to pay for your medications: Yes,  patient will be provided with medication samples and prescriptions at discharge  Release of information consent forms completed and in the chart;  Patient's signature needed at discharge.  Patient to Follow up at: Follow-up Information    Follow up with [facility] On 08/10/2014.   Why:  Hospital follow up appointment on Wednesday July 20th at 8:30 am. Please call office if you need to reschedule appointment.   Contact information:   [address], [address] [place], [place]  [address] Ph:  [phone]  (fax) [phone]      Patient denies SI/HI: Yes,  denies    Safety Planning and Suicide Prevention discussed: Yes,  with patient  Have you used any form of tobacco in the last 30 days? (Cigarettes, Smokeless Tobacco, Cigars, and/or Pipes): No  Has patient been referred to the Quitline?: N/A patient is not a smoker  Inette Doubrava, Belenda CruiseKristin L 08/08/2014, 10:34 AM

## 2014-08-08 NOTE — [facility] Group Notes (Signed)
   [facility]  08/08/2014  8:45 AM   Participation Quality: Alert, Appropriate and Oriented  Mood/Affect: Appropriate  Depression Rating: 1  Anxiety Rating: 4  Thoughts of Suicide: Pt denies SI/HI  Will you contract for safety? Yes  Current AVH: Pt denies  Plan for Discharge/Comments: Pt attended discharge planning group and actively participated in group. CSW provided pt with today's workbook. Patient plans to return home to follow up with outpatient services.  Transportation Means: Pt reports access to transportation  Supports: No supports mentioned at this time  Samuella BruinKristin Thomasa Heidler, MSW, [employer]CSWA Clinical Social Worker [facility] [phone]

## 2014-08-08 NOTE — Progress Notes (Signed)
Pt attended spiritual care group on grief and loss facilitated by chaplain Burnis KingfisherMatthew Lanier Felty. Group opened with brief discussion and psycho-social ed around grief and loss in relationships and in relation to self - identifying life patterns, circumstances, changes that cause losses. Established group norm of speaking from own life experience. Group goal of establishing open and affirming space for members to share loss and experience with grief, normalize grief experience and provide psycho social education and grief support.  Group drew on narrative and Adlerian therapeutic modalities.    Selena BattenCody was present throughout group.  Appropriate affect.  Did not contribute to group discussion.   [name], [name] MDiv

## 2014-08-08 NOTE — Progress Notes (Signed)
Nursing discharge note:  Patient received all personal belongings, medication samples and prescriptions.  Reviewed discharge instructions and medications with patient.  Patient indicated understanding of follow up appts. And importance of continuing his medications.  She denies SI/HI/AVH.  Patient signed consent to have her lab results (most recent) mailed to her.  Patient left ambulatory.  She had her own transportation.

## 2014-08-08 NOTE — [facility] Suicide Risk Assessment (Signed)
[facility] INPATIENT:  Family/Significant Other Suicide Prevention Education  Suicide Prevention Education:  Patient Refusal for Family/Significant Other Suicide Prevention Education: The patient Shane Patrick has refused to provide written consent for family/significant other to be provided Family/Significant Other Suicide Prevention Education during admission and/or prior to discharge.  Physician notified. SPE reviewed with patient and brochure provided. Patient encouraged to return to hospital if having suicidal thoughts, patient verbalized his/her understanding and has no further questions at this time.   [name], [name] 08/08/2014, 11:22 AM

## 2015-11-25 IMAGING — CR DG HAND COMPLETE 3+V*R*
3 series · 3 of 3 positions shown · non-contrast
Comparison: None.

CLINICAL DATA: Flipped over chair today, with hand caught in
falling chair. Pain and laceration on dorsum of hand between the
second and third MCP joints. Initial encounter.

EXAM:
RIGHT HAND - COMPLETE 3+ VIEW

[x hand pa right]
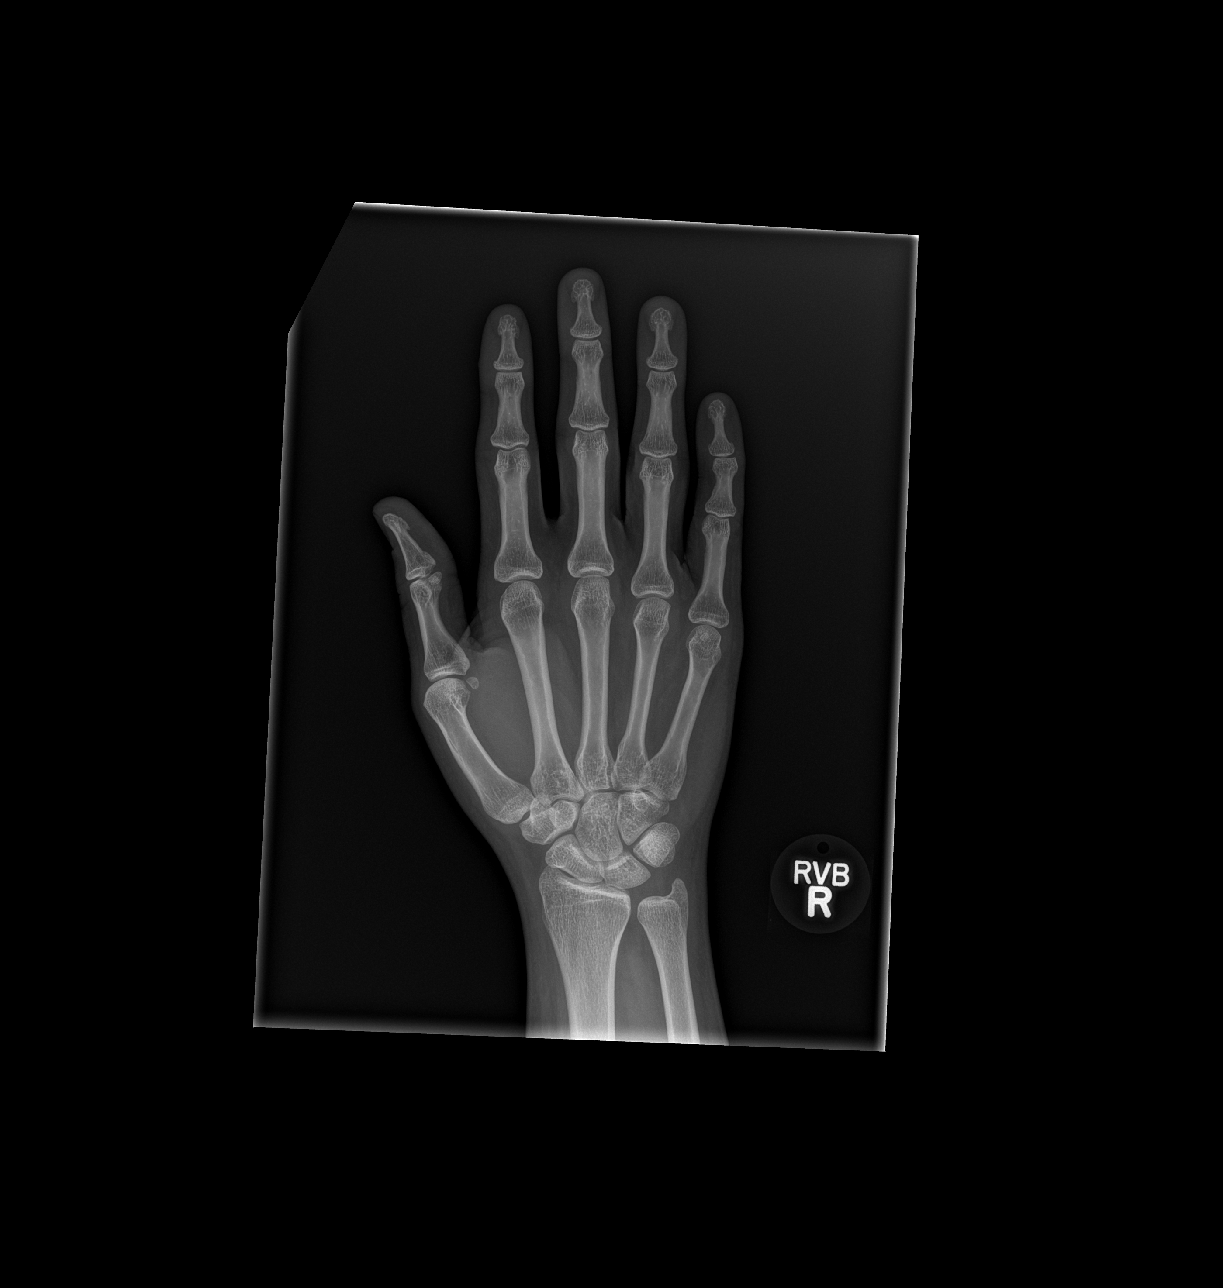

[x hand obl right]
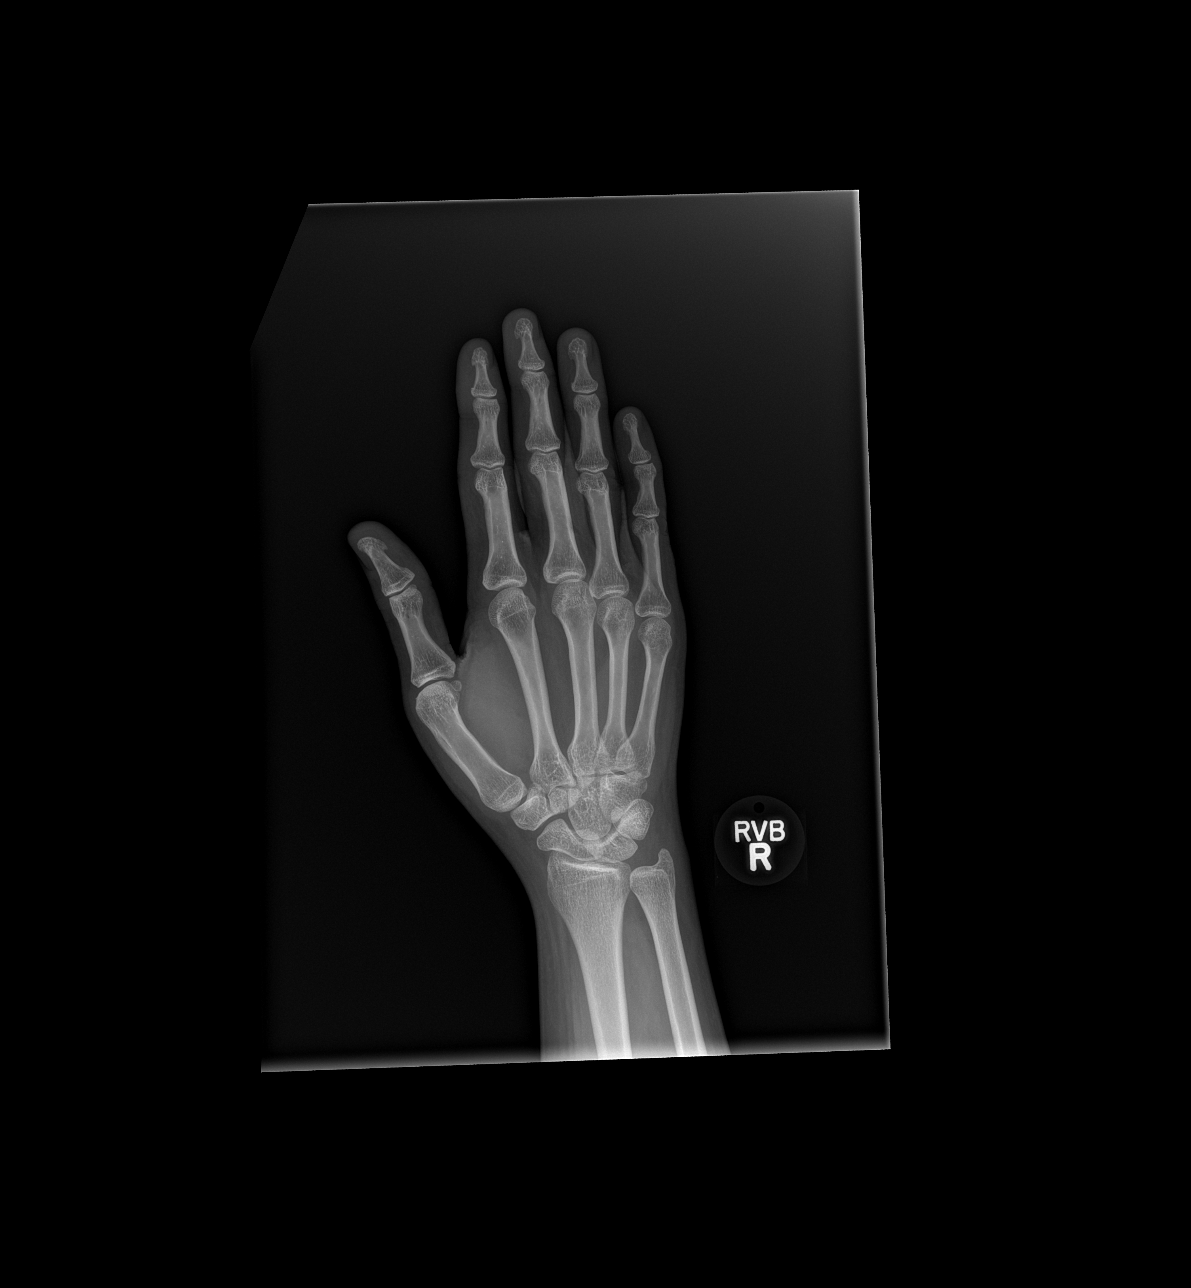

[x hand lat right]
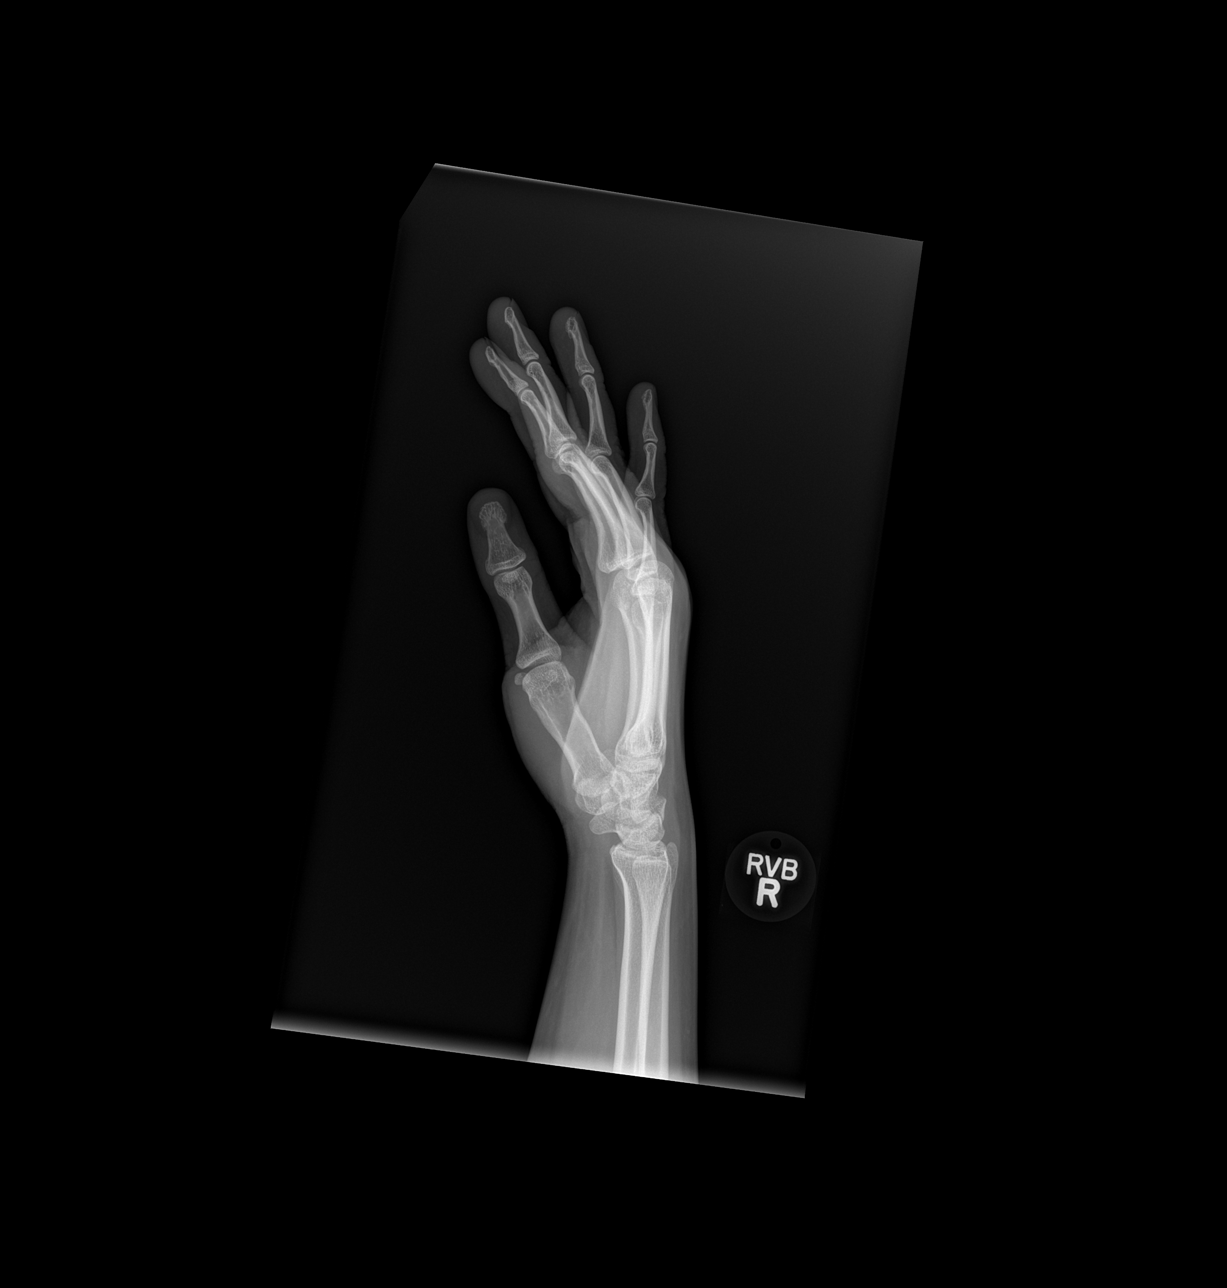

[3 of 3 positions shown; findings below may reference images not displayed]

FINDINGS: There is mild soft tissue swelling along the dorsum of the hand. No
acute fracture or dislocation is identified. Joint space widths are
preserved. Bone mineralization appears normal. No radiopaque foreign
body.
IMPRESSION: Mild soft tissue swelling without acute osseous abnormality
identified.

## 2018-09-10 ENCOUNTER — Other Ambulatory Visit: Payer: Self-pay

## 2018-09-10 DIAGNOSIS — Z20822 Contact with and (suspected) exposure to covid-19: Secondary | ICD-10-CM

## 2018-09-11 LAB — NOVEL CORONAVIRUS, NAA: SARS-CoV-2, NAA: NOT DETECTED

## 2018-10-16 ENCOUNTER — Other Ambulatory Visit: Payer: Self-pay

## 2018-10-16 DIAGNOSIS — Z20822 Contact with and (suspected) exposure to covid-19: Secondary | ICD-10-CM

## 2018-10-17 LAB — NOVEL CORONAVIRUS, NAA: SARS-CoV-2, NAA: NOT DETECTED

## 2018-10-20 ENCOUNTER — Encounter (HOSPITAL_COMMUNITY): Payer: Self-pay

## 2018-10-20 ENCOUNTER — Other Ambulatory Visit: Payer: Self-pay

## 2018-10-20 ENCOUNTER — Emergency Department (HOSPITAL_COMMUNITY): Payer: Self-pay

## 2018-10-20 ENCOUNTER — Emergency Department (HOSPITAL_COMMUNITY)
Admission: EM | Admit: 2018-10-20 | Discharge: 2018-10-20 | Disposition: A | Discharge status: Home / Self Care | Payer: Self-pay | Attending: Emergency Medicine | Admitting: Emergency Medicine

## 2018-10-20 DIAGNOSIS — J069 Acute upper respiratory infection, unspecified: Secondary | ICD-10-CM | POA: Insufficient documentation

## 2018-10-20 LAB — CBC WITH DIFFERENTIAL/PLATELET
Abs Immature Granulocytes: 0.06 10*3/uL (ref 0.00–0.07)
Basophils Absolute: 0.1 10*3/uL (ref 0.0–0.1)
Basophils Relative: 1 %
Eosinophils Absolute: 0.4 10*3/uL (ref 0.0–0.5)
Eosinophils Relative: 4 %
HCT: 49.8 % (ref 39.0–52.0)
Hemoglobin: 15.5 g/dL (ref 13.0–17.0)
Immature Granulocytes: 1 %
Lymphocytes Relative: 30 %
Lymphs Abs: 3.1 10*3/uL (ref 0.7–4.0)
MCH: 28.4 pg (ref 26.0–34.0)
MCHC: 31.1 g/dL (ref 30.0–36.0)
MCV: 91.4 fL (ref 80.0–100.0)
Monocytes Absolute: 0.8 10*3/uL (ref 0.1–1.0)
Monocytes Relative: 8 %
Neutro Abs: 6.1 10*3/uL (ref 1.7–7.7)
Neutrophils Relative %: 56 %
Platelets: 365 10*3/uL (ref 150–400)
RBC: 5.45 MIL/uL (ref 4.22–5.81)
RDW: 13.3 % (ref 11.5–15.5)
WBC: 10.5 10*3/uL (ref 4.0–10.5)
nRBC: 0 % (ref 0.0–0.2)

## 2018-10-20 LAB — BASIC METABOLIC PANEL
Anion gap: 13 (ref 5–15)
BUN: 13 mg/dL (ref 6–20)
CO2: 23 mmol/L (ref 22–32)
Calcium: 9.3 mg/dL (ref 8.9–10.3)
Chloride: 102 mmol/L (ref 98–111)
Creatinine, Ser: 0.94 mg/dL (ref 0.61–1.24)
GFR calc Af Amer: >60 mL/min (ref >60)
GFR calc non Af Amer: >60 mL/min (ref >60)
Glucose, Bld: 105 mg/dL — ABNORMAL HIGH (ref 70–99)
Potassium: 3.7 mmol/L (ref 3.5–5.1)
Sodium: 138 mmol/L (ref 135–145)

## 2018-10-20 MED ORDER — SODIUM CHLORIDE 0.9 % IV BOLUS
1000.0000 mL | Freq: Once | INTRAVENOUS | Status: AC
  Administered 2018-10-20: 10:00:00 1000 mL via INTRAVENOUS

## 2018-10-20 NOTE — ED Triage Notes (Signed)
Pt started having a headache and sore throat on Thursday. Woke up on Friday and felt weak and hot. Pt was tested for COVID on Friday and came back negative. Was tested for flu and strep as well and it came back negative. Diagnosed with a respiratory infection. Feels very dehydrated.

## 2018-10-20 NOTE — ED Provider Notes (Signed)
[facility] EMERGENCY DEPARTMENT Provider Note   CSN: [id number] Arrival date & time: 10/20/18  8416     History   Chief Complaint Chief Complaint  Patient presents with   Cough    HPI Shane Patrick is a 28 y.o. male.  Presenting with feeling sick for the last 6 days.  Started with headache and sore throat.  Friday was tested for Covid that was ultimately negative.  Saw PCP yesterday and had a flu and a strep swab also which were negative.  Continues to feel very dehydrated and was recommended to come to the ER for some fluids.  Complaining of mild headache, still has a sore throat.  Cough with some nasal congestion and chest congestion productive of some mucus.  No known fevers but feeling hot.  No vomiting diarrhea dysuria rashes or other complaints.  Other than feeling generally weak.  Not taking good p.o.  No sick contacts or recent travel.     The history is provided by the patient.  URI Presenting symptoms: congestion, cough, fatigue, fever, rhinorrhea and sore throat   Severity:  Moderate Onset quality:  Gradual Timing:  Constant Progression:  Unchanged Chronicity:  New Relieved by:  Nothing Worsened by:  Nothing Ineffective treatments:  OTC medications Associated symptoms: headaches   Associated symptoms: no arthralgias, no myalgias, no sinus pain, no sneezing and no wheezing   Risk factors: no immunosuppression, no recent illness, no recent travel and no sick contacts     Past Medical History:  Diagnosis Date   ADHD (attention deficit hyperactivity disorder)    Depression     Patient Active Problem List   Diagnosis Date Noted   Major depression, recurrent ([place]) 08/08/2014   Major depressive disorder, recurrent, severe without psychotic features ([place])    Intermittent explosive disorder 07/05/2014   GAD (generalized anxiety disorder) 07/05/2014   MDD (major depressive disorder) 07/04/2014   Laceration    Self-harm    Violent behavior     Past  Surgical History:  Procedure Laterality Date   ADENOIDECTOMY     TONSILLECTOMY          Home Medications    Prior to Admission medications   Medication Sig Start Date End Date Taking? Authorizing Provider  citalopram (CELEXA) 20 MG tablet Take 1 tablet (20 mg total) by mouth daily. 08/08/14   Niel Hummer, NP  divalproex (DEPAKOTE) 250 MG DR tablet Take 1 tablet (250 mg total) by mouth daily. 08/08/14   Niel Hummer, NP  divalproex (DEPAKOTE) 500 MG DR tablet Take 1 tablet (500 mg total) by mouth at bedtime. 08/08/14   Niel Hummer, NP  traZODone (DESYREL) 50 MG tablet Take 1 tablet (50 mg total) by mouth at bedtime as needed for sleep. 08/08/14   Niel Hummer, NP    Family History No family history on file.  Social History Social History   Tobacco Use   Smoking status: Never Smoker   Smokeless tobacco: Never Used  Substance Use Topics   Alcohol use: Yes    Comment: tonight was first use in 1 year per pt   Drug use: No     Allergies   Azithromycin, Ceclor [cefaclor], Erythromycin, and Penicillins   Review of Systems Review of Systems  Constitutional: Positive for fatigue and fever.  HENT: Positive for congestion, rhinorrhea and sore throat. Negative for sinus pain and sneezing.   Eyes: Negative for visual disturbance.  Respiratory: Positive for cough. Negative for wheezing.  Cardiovascular: Negative for chest pain.  Gastrointestinal: Negative for nausea and vomiting.  Genitourinary: Negative for dysuria.  Musculoskeletal: Negative for arthralgias and myalgias.  Skin: Negative for rash.  Neurological: Positive for headaches.     Physical Exam Updated Vital Signs BP 138/79 (BP Location: Right Arm)    Pulse 89    Temp 98 F (36.7 C) (Oral)    Resp 15    Ht 6\' 2"  (1.88 m)    Wt (!) 143.3 kg    SpO2 98%    BMI 40.57 kg/m   Physical Exam Vitals signs and nursing note reviewed.  Constitutional:      Appearance: He is well-developed.  HENT:      Head: Normocephalic and atraumatic.     Mouth/Throat:     Mouth: Mucous membranes are moist.     Pharynx: Oropharynx is clear. No oropharyngeal exudate or posterior oropharyngeal erythema.  Eyes:     Conjunctiva/sclera: Conjunctivae normal.  Neck:     Musculoskeletal: Neck supple.  Cardiovascular:     Rate and Rhythm: Normal rate and regular rhythm.     Heart sounds: No murmur.  Pulmonary:     Effort: Pulmonary effort is normal. No respiratory distress.     Breath sounds: Normal breath sounds.  Abdominal:     Palpations: Abdomen is soft.     Tenderness: There is no abdominal tenderness.  Musculoskeletal: Normal range of motion.     Right lower leg: No edema.     Left lower leg: No edema.  Skin:    General: Skin is warm and dry.     Capillary Refill: Capillary refill takes less than 2 seconds.  Neurological:     General: No focal deficit present.     Mental Status: He is alert.     Gait: Gait normal.      ED Treatments / Results  Labs (all labs ordered are listed, but only abnormal results are displayed) Labs Reviewed  BASIC METABOLIC PANEL - Abnormal; Notable for the following components:      Result Value   Glucose, Bld 105 (*)    All other components within normal limits  CBC WITH DIFFERENTIAL/PLATELET    EKG None  Radiology Dg Chest Port 1 View  Result Date: 10/20/2018 CLINICAL DATA:  Cough, headaches and sore throat since Thursday. EXAM: PORTABLE CHEST 1 VIEW COMPARISON:  Chest x-ray 10/13/2016 FINDINGS: The cardiac silhouette, mediastinal and hilar contours are within normal limits given the AP projection. The lungs are clear. No pleural effusions. The bony thorax is intact. IMPRESSION: No acute cardiopulmonary findings. Electronically Signed   By: Rudie MeyerP.  Gallerani M.D.   On: 10/20/2018 10:46    Procedures Procedures (including critical care time)  Medications Ordered in ED Medications  sodium chloride 0.9 % bolus 1,000 mL (0 mLs Intravenous Stopped 10/20/18  1225)     Initial Impression / Assessment and Plan / ED Course  I have reviewed the triage vital signs and the nursing notes.  Pertinent labs & imaging results that were available during my care of the patient were reviewed by me and considered in my medical decision making (see chart for details).  Clinical Course as of Oct 19 1637  Tue Oct 20, 2018  [id number] 28 year old here with 6 days of subjective fevers headache nasal congestion chest congestion cough sore throat fatigue.  Differential includes Covid, pneumonia, viral syndrome, flu.  He has had Covid and flu testing already and reportedly negative.  Getting some labs chest x-ray  and IV fluids   [MB]  1108 Reevaluated patient.  His IV fluids are infusing.  I updated him on the results of his labs and chest x-ray.  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  I thought with it would be prudent to keep him until Monday with his likely infectious symptoms.   [MB]    Clinical Course User Index [MB] Terrilee Files, MD   Shane Patrick was evaluated in Emergency Department on 10/20/2018 for the symptoms described in the history of present illness. He was evaluated in the context of the global COVID-19 pandemic, which necessitated consideration that the patient might be at risk for infection with the SARS-CoV-2 virus that causes COVID-19. Institutional protocols and algorithms that pertain to the evaluation of patients at risk for COVID-19 are in a state of rapid change based on information released by regulatory bodies including the CDC and federal and state organizations. These policies and algorithms were followed during the patient's care in the ED.      Final Clinical Impressions(s) / ED Diagnoses   Final diagnoses:  Viral URI with cough    ED Discharge Orders    None       Terrilee Files, MD 10/20/18 [phone]

## 2018-10-20 NOTE — Discharge Instructions (Signed)
You were seen in the emergency department for continued cough and otherwise signs of respiratory infection.  You had a chest x-ray and blood work here that did not show any obvious explanation for your symptoms.  This is likely still a viral illness and you should use Tylenol and ibuprofen for pain and drink plenty of fluids and rest.  Follow-up with your doctor return if any worsening symptoms.  You had prior testing for Covid flu and strep already.

## 2018-12-04 ENCOUNTER — Other Ambulatory Visit: Payer: Self-pay

## 2018-12-04 ENCOUNTER — Emergency Department (HOSPITAL_COMMUNITY)
Admission: EM | Admit: 2018-12-04 | Discharge: 2018-12-04 | Disposition: A | Discharge status: Home / Self Care | Payer: Self-pay | Attending: Emergency Medicine | Admitting: Emergency Medicine

## 2018-12-04 ENCOUNTER — Encounter (HOSPITAL_COMMUNITY): Payer: Self-pay | Admitting: Emergency Medicine

## 2018-12-04 DIAGNOSIS — B349 Viral infection, unspecified: Secondary | ICD-10-CM

## 2018-12-04 DIAGNOSIS — U071 COVID-19: Secondary | ICD-10-CM | POA: Insufficient documentation

## 2018-12-04 LAB — GROUP A STREP BY PCR: Group A Strep by PCR: NOT DETECTED

## 2018-12-04 LAB — SARS CORONAVIRUS 2 (TAT 6-24 HRS): SARS Coronavirus 2: POSITIVE — AB

## 2018-12-04 MED ORDER — ONDANSETRON 8 MG PO TBDP
8.0000 mg | ORAL_TABLET | Freq: Once | ORAL | Status: AC
  Administered 2018-12-04: 8 mg via ORAL
  Filled 2018-12-04: qty 1

## 2018-12-04 MED ORDER — PROMETHAZINE HCL 25 MG PO TABS: 25.0000 mg | ORAL_TABLET | Freq: Four times a day (QID) | ORAL | 0 refills | Status: DC | PRN

## 2018-12-04 MED ORDER — ACETAMINOPHEN 325 MG PO TABS
650.0000 mg | ORAL_TABLET | Freq: Once | ORAL | Status: AC
  Administered 2018-12-04: 650 mg via ORAL
  Filled 2018-12-04: qty 2

## 2018-12-04 NOTE — ED Triage Notes (Signed)
PT c/o generalized body aches, chills and cold sweats x3 days and reports sinus congestion and dry cough that started yesterday unrelieved by OTC cold & flu medication.

## 2018-12-04 NOTE — Discharge Instructions (Signed)
Your Covid test is pending.  You will need to isolate at home until your results are back.  You may review your results in MyChart or you will be contacted if your results are positive.  Try drinking sips of clear fluids until the bland diet is tolerated.  You may take Tylenol every 4 hours if needed for body aches or fever.  Follow-up with your primary doctor or return to the ER if you develop any chest or abdominal pain or shortness of breath.

## 2018-12-04 NOTE — ED Provider Notes (Signed)
[facility] EMERGENCY DEPARTMENT Provider Note   CSN: [id number] Arrival date & time: 12/04/18  1302     History   Chief Complaint Chief Complaint  Patient presents with  . Generalized Body Aches    HPI Shane Patrick is a 28 y.o. male.     HPI   Shane Patrick is a 28 y.o. male who presents to the Emergency Department complaining of generalized body aches, fever, chills, and sweats.  Symptoms have been present for 2 days.  He also reports some nasal congestion with rhinorrhea, sore throat and nonproductive cough that began yesterday.  His symptoms have been associated with nausea and several episodes of vomiting today.  He has been taking over-the-counter cold and flu medication without relief.  Subjective fever at home.  He denies chest pain, abdominal pain, shortness of breath and diarrhea.  He states that he is exposed to the general public at his job, but denies known Covid exposures.  Last Covid test was 1 month ago and was negative.    Past Medical History:  Diagnosis Date  . ADHD (attention deficit hyperactivity disorder)   . Depression     Patient Active Problem List   Diagnosis Date Noted  . Major depression, recurrent ([place]) 08/08/2014  . Major depressive disorder, recurrent, severe without psychotic features ([place])   . Intermittent explosive disorder 07/05/2014  . GAD (generalized anxiety disorder) 07/05/2014  . MDD (major depressive disorder) 07/04/2014  . Laceration   . Self-harm   . Violent behavior     Past Surgical History:  Procedure Laterality Date  . ADENOIDECTOMY    . TONSILLECTOMY          Home Medications    Prior to Admission medications   Medication Sig Start Date End Date Taking? Authorizing Provider  estradiol (ESTRACE) 2 MG tablet Take 2 mg by mouth daily. 07/28/17   [provider]    Family History History reviewed. No pertinent family history.  Social History Social History   Tobacco Use  . Smoking status: Never  Smoker  . Smokeless tobacco: Never Used  Substance Use Topics  . Alcohol use: Yes    Comment: socially  . Drug use: Yes    Types: Marijuana    Comment: a few weeks ago     Allergies   Azithromycin, Ceclor [cefaclor], Erythromycin, and Penicillins   Review of Systems Review of Systems  Constitutional: Positive for appetite change, chills and fever. Negative for activity change.  HENT: Positive for congestion, rhinorrhea and sore throat. Negative for ear pain, facial swelling, trouble swallowing and voice change.   Eyes: Negative for pain and visual disturbance.  Respiratory: Negative for shortness of breath and wheezing.   Cardiovascular: Negative for chest pain.  Gastrointestinal: Positive for nausea and vomiting. Negative for abdominal pain.  Genitourinary: Negative for difficulty urinating, dysuria and frequency.  Musculoskeletal: Positive for myalgias. Negative for arthralgias, neck pain and neck stiffness.  Skin: Negative for color change and rash.  Neurological: Negative for dizziness, facial asymmetry, speech difficulty, numbness and headaches.  Hematological: Negative for adenopathy.     Physical Exam Updated Vital Signs BP 133/89   Pulse (!) 115   Temp 99 F (37.2 C) (Oral)   Resp 18   Ht 6\' 2"  (1.88 m)   Wt (!) 143.3 kg   SpO2 96%   BMI 40.57 kg/m   Physical Exam Vitals signs and nursing note reviewed.  Constitutional:      Appearance: Normal appearance. He  is not ill-appearing.  HENT:     Nose: No rhinorrhea.     Mouth/Throat:     Mouth: Mucous membranes are moist.     Pharynx: No oropharyngeal exudate or posterior oropharyngeal erythema.  Neck:     Musculoskeletal: Normal range of motion.  Cardiovascular:     Rate and Rhythm: Regular rhythm. Tachycardia present.     Pulses: Normal pulses.  Pulmonary:     Effort: Pulmonary effort is normal.     Breath sounds: Normal breath sounds. No wheezing, rhonchi or rales.  Abdominal:     General: There is  no distension.     Palpations: Abdomen is soft.     Tenderness: There is no abdominal tenderness. There is no guarding.  Musculoskeletal: Normal range of motion.  Lymphadenopathy:     Cervical: No cervical adenopathy.  Skin:    General: Skin is warm.     Capillary Refill: Capillary refill takes less than 2 seconds.  Neurological:     General: No focal deficit present.     Mental Status: He is alert.     Sensory: No sensory deficit.     Motor: No weakness.      ED Treatments / Results  Labs (all labs ordered are listed, but only abnormal results are displayed) Labs Reviewed  GROUP A STREP BY PCR  SARS CORONAVIRUS 2 (TAT 6-24 HRS)    EKG None  Radiology No results found.  Procedures Procedures (including critical care time)  Medications Ordered in ED Medications  ondansetron (ZOFRAN-ODT) disintegrating tablet 8 mg (has no administration in time range)  acetaminophen (TYLENOL) tablet 650 mg (has no administration in time range)     Initial Impression / Assessment and Plan / ED Course  I have reviewed the triage vital signs and the nursing notes.  Pertinent labs & imaging results that were available during my care of the patient were reviewed by me and considered in my medical decision making (see chart for details).        Patient is well-appearing nontoxic.  Vital signs reviewed.  He is mildly tachycardic but also had several episodes of vomiting prior to arrival.  Will administer antiemetic and oral fluid challenge and reassess.  Covid test and strep pending.  On recheck, patient reports feeling better.  Strep test is negative.  He has tolerated oral fluid challenge without difficulty.  Vital signs also improved after oral fluids.  I feel he is appropriate for discharge home, Covid test is still pending.  He agrees to isolate at home until his test results are back.  return precautions were discussed.  Final Clinical Impressions(s) / ED Diagnoses   Final  diagnoses:  Viral illness    ED Discharge Orders    None       Pauline Aus, PA-C 12/04/18 1607    Donnetta Hutching, MD 12/05/18 [phone]

## 2018-12-16 ENCOUNTER — Other Ambulatory Visit: Payer: Self-pay | Admitting: *Deleted

## 2018-12-16 DIAGNOSIS — Z20822 Contact with and (suspected) exposure to covid-19: Secondary | ICD-10-CM

## 2018-12-16 NOTE — Addendum Note (Signed)
Addended by: Marvene Staff on: 12/16/2018 09:43 AM   Modules accepted: Orders

## 2019-11-10 ENCOUNTER — Inpatient Hospital Stay (HOSPITAL_COMMUNITY)
Admission: RE | Admit: 2019-11-10 | Discharge: 2019-11-14 | DRG: 885 | Disposition: A | Discharge status: Home / Self Care | Payer: Federal, State, Local not specified - Other | Attending: Psychiatry | Admitting: Psychiatry

## 2019-11-10 ENCOUNTER — Other Ambulatory Visit: Payer: Self-pay

## 2019-11-10 ENCOUNTER — Encounter (HOSPITAL_COMMUNITY): Payer: Self-pay | Admitting: Nurse Practitioner

## 2019-11-10 DIAGNOSIS — F648 Other gender identity disorders: Secondary | ICD-10-CM

## 2019-11-10 DIAGNOSIS — F649 Gender identity disorder, unspecified: Secondary | ICD-10-CM | POA: Diagnosis present

## 2019-11-10 DIAGNOSIS — Z6836 Body mass index (BMI) 36.0-36.9, adult: Secondary | ICD-10-CM

## 2019-11-10 DIAGNOSIS — G4710 Hypersomnia, unspecified: Secondary | ICD-10-CM | POA: Diagnosis present

## 2019-11-10 DIAGNOSIS — F4010 Social phobia, unspecified: Secondary | ICD-10-CM | POA: Diagnosis present

## 2019-11-10 DIAGNOSIS — E669 Obesity, unspecified: Secondary | ICD-10-CM | POA: Diagnosis present

## 2019-11-10 DIAGNOSIS — F339 Major depressive disorder, recurrent, unspecified: Secondary | ICD-10-CM

## 2019-11-10 DIAGNOSIS — R45851 Suicidal ideations: Secondary | ICD-10-CM | POA: Diagnosis not present

## 2019-11-10 DIAGNOSIS — F411 Generalized anxiety disorder: Secondary | ICD-10-CM | POA: Diagnosis present

## 2019-11-10 DIAGNOSIS — Z20822 Contact with and (suspected) exposure to covid-19: Secondary | ICD-10-CM | POA: Diagnosis present

## 2019-11-10 DIAGNOSIS — F332 Major depressive disorder, recurrent severe without psychotic features: Principal | ICD-10-CM | POA: Diagnosis present

## 2019-11-10 HISTORY — DX: Anxiety disorder, unspecified: F41.9

## 2019-11-10 LAB — RESPIRATORY PANEL BY RT PCR (FLU A&B, COVID)
Influenza A by PCR: NEGATIVE
Influenza B by PCR: NEGATIVE
SARS Coronavirus 2 by RT PCR: NEGATIVE

## 2019-11-10 MED ORDER — VENLAFAXINE HCL ER 37.5 MG PO CP24
37.5000 mg | ORAL_CAPSULE | Freq: Every day | ORAL | Status: DC
  Administered 2019-11-11 – 2019-11-12 (×2): 37.5 mg via ORAL
  Filled 2019-11-10 (×5): qty 1

## 2019-11-10 MED ORDER — ALUM & MAG HYDROXIDE-SIMETH 200-200-20 MG/5ML PO SUSP: 30.0000 mL | ORAL | Status: DC | PRN

## 2019-11-10 MED ORDER — ACETAMINOPHEN 325 MG PO TABS
650.0000 mg | ORAL_TABLET | Freq: Four times a day (QID) | ORAL | Status: DC | PRN
  Administered 2019-11-14: 650 mg via ORAL
  Filled 2019-11-10: qty 2

## 2019-11-10 MED ORDER — MAGNESIUM HYDROXIDE 400 MG/5ML PO SUSP: 30.0000 mL | Freq: Every day | ORAL | Status: DC | PRN

## 2019-11-10 MED ORDER — HYDROXYZINE HCL 25 MG PO TABS
25.0000 mg | ORAL_TABLET | Freq: Three times a day (TID) | ORAL | Status: DC | PRN
  Administered 2019-11-10 – 2019-11-13 (×5): 25 mg via ORAL
  Filled 2019-11-10 (×5): qty 1
  Filled 2019-11-10: qty 10

## 2019-11-10 MED ORDER — SPIRONOLACTONE 100 MG PO TABS
100.0000 mg | ORAL_TABLET | Freq: Every day | ORAL | Status: DC
  Administered 2019-11-11 – 2019-11-12 (×2): 100 mg via ORAL
  Filled 2019-11-10 (×2): qty 1
  Filled 2019-11-10: qty 7
  Filled 2019-11-10 (×3): qty 1

## 2019-11-10 MED ORDER — TRAZODONE HCL 50 MG PO TABS
50.0000 mg | ORAL_TABLET | Freq: Every evening | ORAL | Status: DC | PRN
  Administered 2019-11-11 – 2019-11-13 (×3): 50 mg via ORAL
  Filled 2019-11-10 (×4): qty 1
  Filled 2019-11-10: qty 7

## 2019-11-10 MED ORDER — ESTRADIOL 1 MG PO TABS
2.0000 mg | ORAL_TABLET | Freq: Every day | ORAL | Status: DC
  Administered 2019-11-11 – 2019-11-14 (×4): 2 mg via ORAL
  Filled 2019-11-10 (×5): qty 1
  Filled 2019-11-10: qty 14

## 2019-11-10 MED ORDER — HYDROXYZINE HCL 25 MG PO TABS
ORAL_TABLET | ORAL | Status: AC
  Filled 2019-11-10: qty 1

## 2019-11-10 NOTE — Tx Team (Signed)
Initial Treatment Plan 11/10/2019 2:44 PM Shane Patrick TRR:[id number]    PATIENT STRESSORS: Other: chronic mental health, history of trauma   PATIENT STRENGTHS: Ability for insight Average or above average intelligence Communication skills Physical Health Supportive family/friends   PATIENT IDENTIFIED PROBLEMS: depression  Anxiety  Thoughts of self harm                 DISCHARGE CRITERIA:  Improved stabilization in mood, thinking, and/or behavior Motivation to continue treatment in a less acute level of care Verbal commitment to aftercare and medication compliance  PRELIMINARY DISCHARGE PLAN: Outpatient therapy Return to previous living arrangement Return to previous work or school arrangements  PATIENT/FAMILY INVOLVEMENT: This treatment plan has been presented to and reviewed with the patient, Shane Patrick.  The patient has been given the opportunity to ask questions and make suggestions.  Olin Pia, RN 11/10/2019, 2:44 PM
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
## 2019-11-10 NOTE — BHH Suicide Risk Assessment (Signed)
[facility] Admission Suicide Risk Assessment   Nursing information obtained from:  Patient Demographic factors:  Male Current Mental Status:  Suicidal ideation indicated by patient Loss Factors:  NA Historical Factors:  Prior suicide attempts, NA Risk Reduction Factors:  Employed, Living with another person, especially a relative  Total Time spent with patient: 30 minutes Principal Problem: <principal problem not specified> Diagnosis:  Active Problems:   Severe recurrent major depression without psychotic features (HCC)  Data: 29 year old person with major depressive disorder presenting with suicidal ideation.  Continued Clinical Symptoms:  Alcohol Use Disorder Identification Test Final Score (AUDIT): 0 The "Alcohol Use Disorders Identification Test", Guidelines for Use in Primary Care, Second Edition.  World Science writer [facility]). Score between 0-7:  no or low risk or alcohol related problems. Score between 8-15:  moderate risk of alcohol related problems. Score between 16-19:  high risk of alcohol related problems. Score 20 or above:  warrants further diagnostic evaluation for alcohol dependence and treatment.   CLINICAL FACTORS:   Depression:   Anhedonia Hopelessness  COGNITIVE FEATURES THAT CONTRIBUTE TO RISK:  None    SUICIDE RISK:   Moderate:  Frequent suicidal ideation with limited intensity, and duration, some specificity in terms of plans, no associated intent, good self-control, limited dysphoria/symptomatology, some risk factors present, and identifiable protective factors, including available and accessible social support.  PLAN OF CARE: Continue care on inpatient psychiatric unit  I certify that inpatient services furnished can reasonably be expected to improve the patient's condition.   Clement Sayres, MD 11/10/2019, 4:49 PM
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
## 2019-11-10 NOTE — B[facility] Group Notes (Signed)
[facility] LCSW Group Therapy  11/10/2019 2:51 PM  Type of Therapy:  Coping Skills  Participation Level:  Did Not Attend   Summary of Progress/Problems: This patient was invited to attend group, however this patient chose not to attend.    Shane Patrick A Hilja Kintzel 11/10/2019, 2:51 PM  

## 2019-11-10 NOTE — Progress Notes (Addendum)
Patient ID: Shane Patrick, adult   DOB: 05-13-1990, 29 y.o.   MRN: [id number] Patient presents voluntarily complaining of increased depression and anxiety which has been going on for a while. He also endorses suicidal thoughts, planning to wreck his car.  He reports that he has had depression since he was a kid. Reports no current or recent stressors "I have always been depressed". Patient reports that he was treated here twice back in 2016. The first time he was suffering from depression and anxiety. The second time he was depressed but also had substance use issue. Today he presents with no recent use of substances. No current psychiatric medications. He presents with no significant medical issues: only see his PCP for his hormonal therapy. Patient is in transgender process and reports that its going well. His appetite has been poor due to his depression.  During assessment, patient became increasingly anxious  with crying spells and received Vistaril 25 mg PO. Patient lives at his parents house and reports no issues there. He also has a brother. His grandmother is very supportive. He is employed, currently has 2 jobs.  Patient completed 9th grade and he is able to write and read. He reports that his goal is to  Work on his depression and anxiety and be able to live a happy life. Skin assessment performed and there are no issues to report. Patient was admitted and oriented to the unit. Safety preautions initiated.

## 2019-11-10 NOTE — Progress Notes (Signed)
No belongings brought in. Only one car key in the locker.

## 2019-11-10 NOTE — Plan of Care (Signed)
Newly admitted. Sad, depressed and anxious. Expressing suicidal thoughts but contracts for safety. Received Vistaril 25 mg PO and currently calm in bed. Emotional support provided. Safety precautions reinforced.

## 2019-11-11 DIAGNOSIS — F339 Major depressive disorder, recurrent, unspecified: Secondary | ICD-10-CM | POA: Diagnosis not present

## 2019-11-11 DIAGNOSIS — F648 Other gender identity disorders: Secondary | ICD-10-CM | POA: Diagnosis not present

## 2019-11-11 DIAGNOSIS — R45851 Suicidal ideations: Secondary | ICD-10-CM | POA: Diagnosis not present

## 2019-11-11 LAB — COMPREHENSIVE METABOLIC PANEL
ALT: 24 U/L (ref 0–44)
AST: 19 U/L (ref 15–41)
Albumin: 4.2 g/dL (ref 3.5–5.0)
Alkaline Phosphatase: 77 U/L (ref 38–126)
Anion gap: 10 (ref 5–15)
BUN: 16 mg/dL (ref 6–20)
CO2: 24 mmol/L (ref 22–32)
Calcium: 9.2 mg/dL (ref 8.9–10.3)
Chloride: 103 mmol/L (ref 98–111)
Creatinine, Ser: 0.76 mg/dL (ref 0.61–1.24)
GFR, Estimated: >60 mL/min (ref >60)
Glucose, Bld: 91 mg/dL (ref 70–99)
Potassium: 4.1 mmol/L (ref 3.5–5.1)
Sodium: 137 mmol/L (ref 135–145)
Total Bilirubin: 0.9 mg/dL (ref 0.3–1.2)
Total Protein: 8.3 g/dL — ABNORMAL HIGH (ref 6.5–8.1)

## 2019-11-11 LAB — LIPID PANEL
Cholesterol: 302 mg/dL — ABNORMAL HIGH (ref 0–200)
HDL: 43 mg/dL (ref >40)
LDL Cholesterol: 237 mg/dL — ABNORMAL HIGH (ref 0–99)
Total CHOL/HDL Ratio: 7 RATIO
Triglycerides: 110 mg/dL (ref <150)
VLDL: 22 mg/dL (ref 0–40)

## 2019-11-11 LAB — CBC
HCT: 48.6 % (ref 39.0–52.0)
Hemoglobin: 15.7 g/dL (ref 13.0–17.0)
MCH: 29.8 pg (ref 26.0–34.0)
MCHC: 32.3 g/dL (ref 30.0–36.0)
MCV: 92.4 fL (ref 80.0–100.0)
Platelets: 335 10*3/uL (ref 150–400)
RBC: 5.26 MIL/uL (ref 4.22–5.81)
RDW: 13.2 % (ref 11.5–15.5)
WBC: 11.3 10*3/uL — ABNORMAL HIGH (ref 4.0–10.5)
nRBC: 0 % (ref 0.0–0.2)

## 2019-11-11 LAB — TSH: TSH: 1.406 u[IU]/mL (ref 0.350–4.500)

## 2019-11-11 NOTE — B[facility] Suicide Risk Assessment (Addendum)
[facility] INPATIENT:  Family/Significant Other Suicide Prevention Education  Suicide Prevention Education:  Education Completed; Shane Patrick [phone] Database administrator) has been identified by the patient as the family member/significant other with whom the patient will be residing, and identified as the person(s) who will aid the patient in the event of a mental health crisis (suicidal ideations/suicide attempt).  With written consent from the patient, the family member/significant other has been provided the following suicide prevention education, prior to the and/or following the discharge of the patient.  The suicide prevention education provided includes the following:  Suicide risk factors  Suicide prevention and interventions  National Suicide Hotline telephone number  [facility] assessment telephone number  [facility] Emergency Assistance 911  [facility] and/or Residential Mobile Crisis Unit telephone number  Request made of family/significant other to:  Remove weapons (e.g., guns, rifles, knives), all items previously/currently identified as safety concern.    Remove drugs/medications (over-the-counter, prescriptions, illicit drugs), all items previously/currently identified as a safety concern.  The family member/significant other verbalizes understanding of the suicide prevention education information provided.  The family member/significant other agrees to remove the items of safety concern listed above.   Shane Patrick states that Shane Patrick gets depressed "every once in a while" and that this time it happened without warning.  Shane Patrick states that Shane Patrick was happy that morning and then she learned that he checked into the hospital.  Shane Patrick states that she is extremely close to Shane Patrick and that they spend a lot of time together.  Shane Patrick states that Shane Patrick often argues with his parents but does not argue with her.  Shane Patrick states that Shane Patrick because very upset when  his grandfather passed in 2016.  Shane Patrick states that Shane Patrick's car is in her yard and that she has the keys to the car.  Shane Patrick states that Shane Patrick has a good friend named Shane Patrick, Shane Patrick that he talks to for multiple hours each day.  CSW reviewed SPE with Shane Patrick. There are no firearms or weapons in the home.     Shane Patrick 11/11/2019, 1:54 PM

## 2019-11-11 NOTE — Progress Notes (Signed)
Pt A & O X4. Presents guarded with flat affect and depressed mood but forwards on interaction. Rates his depression and anxiety both 6/10 "I keep stuff in a lot and being around people stress me out". Reports he slept well last night with a good appetite. Pt slept in bed majority of this shift.  Support and encouragement provided to pt. Scheduled medications given and effects monitored. Safety checks maintained without incident.  Pt went off unit for meals and returned without issues. Complaint with medications as ordered. Denies discomfort or adverse drug reactions at this time.
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
## 2019-11-11 NOTE — Progress Notes (Signed)
[facility] MD Progress Note  11/11/2019 2:41 PM Shane Patrick  MRN:  [id number]  Principal Problem: <principal problem not specified> Diagnosis: Active Problems:   Severe recurrent major depression without psychotic features (HCC)  Total Time spent with patient: 20 minutes  Daily note:  Patient seen, chart reviewed, case discussed with treatment team.  Patient was seen sleeping in his room.  He states that he is still tired which she knows is a symptom of his depression.  Patient was compliant with medication this morning without any side effects reported or observed.  Mood remains poor.  Patient continues to have passive SI.  he is eating well and continues to oversleep.  Patient was encouraged to continue with the plan and the regimen as it does take time for psychiatric medications to work.  Patient expressed agreement and understanding.  Past Medical History:  Past Medical History:  Diagnosis Date  . ADHD (attention deficit hyperactivity disorder)   . Anxiety   . Depression     Past Surgical History:  Procedure Laterality Date  . ADENOIDECTOMY    . TONSILLECTOMY     Family History: History reviewed. No pertinent family history.  Social History:  Social History   Substance and Sexual Activity  Alcohol Use Not Currently   Comment: socially     Social History   Substance and Sexual Activity  Drug Use Not Currently   Comment: a few weeks ago    Social History   Socioeconomic History  . Marital status: Single    Spouse name: Not on file  . Number of children: Not on file  . Years of education: Not on file  . Highest education level: Not on file  Occupational History  . Not on file  Tobacco Use  . Smoking status: Never Smoker  . Smokeless tobacco: Never Used  Vaping Use  . Vaping Use: Never used  Substance and Sexual Activity  . Alcohol use: Not Currently    Comment: socially  . Drug use: Not Currently    Comment: a few weeks ago  . Sexual activity: Not Currently   Other Topics Concern  . Not on file  Social History Narrative  . Not on file   Social Determinants of Health   Financial Resource Strain:   . Difficulty of Paying Living Expenses: Not on file  Food Insecurity:   . Worried About Programme researcher, broadcasting/film/video in the Last Year: Not on file  . Ran Out of Food in the Last Year: Not on file  Transportation Needs:   . Lack of Transportation (Medical): Not on file  . Lack of Transportation (Non-Medical): Not on file  Physical Activity:   . Days of Exercise per Week: Not on file  . Minutes of Exercise per Session: Not on file  Stress:   . Feeling of Stress : Not on file  Social Connections:   . Frequency of Communication with Friends and Family: Not on file  . Frequency of Social Gatherings with Friends and Family: Not on file  . Attends Religious Services: Not on file  . Active Member of Clubs or Organizations: Not on file  . Attends Banker Meetings: Not on file  . Marital Status: Not on file   Additional Social History:    Pain Medications: Please see MAR Prescriptions: Please see MAR Over the Counter: Please see MAR History of alcohol / drug use?: Yes Longest period of sobriety (when/how long): 5 years Name of Substance 1: EtOH 1 -  Age of First Use: Unknown 1 - Amount (size/oz): Pt cannot remember 1 - Frequency: Pt cannot remember 1 - Duration: Unknown 1 - Last Use / Amount: Pt abused alcohol and even attempted to o/d on alcohol; they stopped using, knowing it would kill them.                  Sleep: Good  Appetite:  Good  Current Medications: Current Facility-Administered Medications  Medication Dose Route Frequency Provider Last Rate Last Admin  . acetaminophen (TYLENOL) tablet 650 mg  650 mg Oral Q6H PRN [name] A, NP      . alum & mag hydroxide-simeth (MAALOX/MYLANTA) 200-200-20 MG/5ML suspension 30 mL  30 mL Oral Q4H PRN [name] A, NP      . estradiol (ESTRACE) tablet 2 mg  2 mg Oral Daily  Liliana Brentlinger, Worthy Rancher, MD   2 mg at 11/11/19 0809  . hydrOXYzine (ATARAX/VISTARIL) tablet 25 mg  25 mg Oral TID PRN [name] A, NP   25 mg at 11/10/19 1232  . magnesium hydroxide (MILK OF MAGNESIA) suspension 30 mL  30 mL Oral Daily PRN [name] A, NP      . spironolactone (ALDACTONE) tablet 100 mg  100 mg Oral Daily Behr Cislo, Worthy Rancher, MD   100 mg at 11/11/19 0809  . traZODone (DESYREL) tablet 50 mg  50 mg Oral QHS PRN [name] A, NP      . venlafaxine XR (EFFEXOR-XR) 24 hr capsule 37.5 mg  37.5 mg Oral Q breakfast Deshanda Molitor, Worthy Rancher, MD   37.5 mg at 11/11/19 0240    Lab Results:  Results for orders placed or performed during the hospital encounter of 11/10/19 (from the past 48 hour(s))  Respiratory Panel by RT PCR (Flu A&B, Covid) - Nasopharyngeal Swab     Status: None   Collection Time: 11/10/19  5:47 AM   Specimen: Nasopharyngeal Swab  Result Value Ref Range   SARS Coronavirus 2 by RT PCR NEGATIVE NEGATIVE    Comment: (NOTE) SARS-CoV-2 target nucleic acids are NOT DETECTED.  The SARS-CoV-2 RNA is generally detectable in upper respiratoy specimens during the acute phase of infection. The lowest concentration of SARS-CoV-2 viral copies this assay can detect is 131 copies/mL. A negative result does not preclude SARS-Cov-2 infection and should not be used as the sole basis for treatment or other patient management decisions. A negative result may occur with  improper specimen collection/handling, submission of specimen other than nasopharyngeal swab, presence of viral mutation(s) within the areas targeted by this assay, and inadequate number of viral copies (<131 copies/mL). A negative result must be combined with clinical observations, patient history, and epidemiological information. The expected result is Negative.  Fact Sheet for Patients:  https://www.moore.com/  Fact Sheet for Healthcare Providers:  https://www.young.biz/  This  test is no t yet approved or cleared by the [place] FDA and  has been authorized for detection and/or diagnosis of SARS-CoV-2 by FDA under an Emergency Use Authorization (EUA). This EUA will remain  in effect (meaning this test can be used) for the duration of the COVID-19 declaration under Section 564(b)(1) of the Act, 21 U.S.C. section 360bbb-3(b)(1), unless the authorization is terminated or revoked sooner.     Influenza A by PCR NEGATIVE NEGATIVE   Influenza B by PCR NEGATIVE NEGATIVE    Comment: (NOTE) The Xpert Xpress SARS-CoV-2/FLU/RSV assay is intended as an aid in  the diagnosis of influenza from Nasopharyngeal swab specimens and  should  not be used as a sole basis for treatment. Nasal washings and  aspirates are unacceptable for Xpert Xpress SARS-CoV-2/FLU/RSV  testing.  Fact Sheet for Patients: https://www.moore.com/  Fact Sheet for Healthcare Providers: https://www.young.biz/  This test is not yet approved or cleared by the [place] FDA and  has been authorized for detection and/or diagnosis of SARS-CoV-2 by  FDA under an Emergency Use Authorization (EUA). This EUA will remain  in effect (meaning this test can be used) for the duration of the  Covid-19 declaration under Section 564(b)(1) of the Act, 21  U.S.C. section 360bbb-3(b)(1), unless the authorization is  terminated or revoked. Performed at [facility], [address], [place], [place] [address]     Blood Alcohol level:  Lab Results  Component Value Date   ETH <5 08/06/2014   ETH <5 07/03/2014    Metabolic Disorder Labs: No results found for: HGBA1C, MPG No results found for: PROLACTIN No results found for: CHOL, TRIG, HDL, CHOLHDL, VLDL, LDLCALC  Physical Findings: AIMS:  , ,  ,  ,    CIWA:    COWS:      Musculoskeletal: Strength & Muscle Tone: within normal limits Gait & Station: normal Patient leans: N/A  Psychiatric  Specialty Exam: Physical Exam Constitutional:      Appearance: He is obese.  HENT:     Head: Normocephalic and atraumatic.  Musculoskeletal:     Cervical back: Normal range of motion.  Neurological:     Mental Status: He is alert and oriented to person, place, and time.     Review of Systems  All other systems reviewed and are negative.   Blood pressure 121/72, pulse 81, temperature 97.9 F (36.6 C), temperature source Oral, resp. rate 16, height 6\' 2"  (1.88 m), weight 130.2 kg, SpO2 100 %.Body mass index is 36.85 kg/m.  General Appearance: Disheveled  Eye Contact:  Good  Speech:  Clear and Coherent  Volume:  Normal  Mood:  Anxious and Depressed  Affect:  Appropriate, Congruent, Constricted and Depressed  Thought Process:  Coherent  Orientation:  Full (Time, Place, and Person)  Thought Content:  Logical and Rumination  Suicidal Thoughts:  Yes.  without intent/plan  Homicidal Thoughts:  No  Memory:  Recent;   Fair  Judgement:  Impaired  Insight:  Present  Psychomotor Activity:  Normal  Concentration:  Concentration: Fair  Recall:  of Knowledge:  Fair  Language:  Fair  Akathisia:  No  Handed:  Right  AIMS (if indicated):     Assets:  Communication Skills Desire for Improvement Housing Physical Health Resilience Social Support Vocational/Educational  ADL's:  Intact  Cognition:  WNL  Sleep:  Number of Hours: 6.75    Treatment Plan Summary: Daily contact with patient to assess and evaluate symptoms and progress in treatment and Medication management    This is a 29 year old male to male transgender person who is presenting with major depressive disorder, recurrent. Patient will benefit from inpatient hospitalization for purposes of safety, stabilization, medication management. Patient does seem to have some identity issues contributing to depression. This is further worsened by significant social anxiety.  Plan: Continue with Effexor 24-hour 37.5 mg  p.o. every morning Patient continues to require inpatient hospitalization.   37, MD 11/11/2019, 2:41 PM

## 2019-11-12 DIAGNOSIS — F332 Major depressive disorder, recurrent severe without psychotic features: Secondary | ICD-10-CM | POA: Diagnosis not present

## 2019-11-12 LAB — RAPID URINE DRUG SCREEN, HOSP PERFORMED
Amphetamines: NOT DETECTED
Barbiturates: NOT DETECTED
Benzodiazepines: NOT DETECTED
Cocaine: NOT DETECTED
Opiates: NOT DETECTED
Tetrahydrocannabinol: NOT DETECTED

## 2019-11-12 MED ORDER — VENLAFAXINE HCL ER 75 MG PO CP24
75.0000 mg | ORAL_CAPSULE | Freq: Every day | ORAL | Status: DC
  Administered 2019-11-13 – 2019-11-14 (×2): 75 mg via ORAL
  Filled 2019-11-12 (×2): qty 1
  Filled 2019-11-12: qty 7
  Filled 2019-11-12 (×2): qty 1

## 2019-11-12 NOTE — [facility] Group Notes (Signed)
[facility] LCSW Group Therapy  11/12/2019 1:30 PM  Type of Therapy: Self-Care  Participation Level:  Did Not Attend   Summary of Progress/Problems: This patient was invited to attend group, however this patient chose not to attend.    Jacinta Shoe, LCSW 11/12/2019, 2:11 PM

## 2019-11-12 NOTE — Progress Notes (Signed)
[facility] MD Progress Note  11/12/2019 4:17 PM Shane Patrick  MRN:  [id number]  Principal Problem: <principal problem not specified> Diagnosis: Active Problems:   Severe recurrent major depression without psychotic features (HCC)  Total Time spent with patient: 20 minutes  Daily note:  Patient seen, chart reviewed, case discussed with treatment team.  Patient was seen laying down in bed in his room.  Patient states that he is still tired and has little to no energy or motivation to get out of bed.  He was informed that his medication will be increased starting tomorrow and he expressed agreement.  No side effects observed or reported.  Patient states his appetite is poor and his mood remains low.  Agreeable to continued inpatient treatment  Past Medical History:  Past Medical History:  Diagnosis Date  . ADHD (attention deficit hyperactivity disorder)   . Anxiety   . Depression     Past Surgical History:  Procedure Laterality Date  . ADENOIDECTOMY    . TONSILLECTOMY     Family History: History reviewed. No pertinent family history.  Social History:  Social History   Substance and Sexual Activity  Alcohol Use Not Currently   Comment: socially     Social History   Substance and Sexual Activity  Drug Use Not Currently   Comment: a few weeks ago    Social History   Socioeconomic History  . Marital status: Single    Spouse name: Not on file  . Number of children: Not on file  . Years of education: Not on file  . Highest education level: Not on file  Occupational History  . Not on file  Tobacco Use  . Smoking status: Never Smoker  . Smokeless tobacco: Never Used  Vaping Use  . Vaping Use: Never used  Substance and Sexual Activity  . Alcohol use: Not Currently    Comment: socially  . Drug use: Not Currently    Comment: a few weeks ago  . Sexual activity: Not Currently  Other Topics Concern  . Not on file  Social History Narrative  . Not on file   Social  Determinants of Health   Financial Resource Strain:   . Difficulty of Paying Living Expenses: Not on file  Food Insecurity:   . Worried About Programme researcher, broadcasting/film/video in the Last Year: Not on file  . Ran Out of Food in the Last Year: Not on file  Transportation Needs:   . Lack of Transportation (Medical): Not on file  . Lack of Transportation (Non-Medical): Not on file  Physical Activity:   . Days of Exercise per Week: Not on file  . Minutes of Exercise per Session: Not on file  Stress:   . Feeling of Stress : Not on file  Social Connections:   . Frequency of Communication with Friends and Family: Not on file  . Frequency of Social Gatherings with Friends and Family: Not on file  . Attends Religious Services: Not on file  . Active Member of Clubs or Organizations: Not on file  . Attends Banker Meetings: Not on file  . 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                  Sleep: Good  Appetite:  Good  Current Medications: Current Facility-Administered Medications  Medication Dose Route Frequency Provider Last Rate Last Admin  . acetaminophen (TYLENOL) tablet 650 mg  650 mg Oral Q6H PRN [name] A, NP      . alum & mag hydroxide-simeth (MAALOX/MYLANTA) 200-200-20 MG/5ML suspension 30 mL  30 mL Oral Q4H PRN [name] A, NP      . estradiol (ESTRACE) tablet 2 mg  2 mg Oral Daily Rylend Pietrzak, Worthy Rancher, MD   2 mg at 11/12/19 0802  . hydrOXYzine (ATARAX/VISTARIL) tablet 25 mg   25 mg Oral TID PRN Jackelyn Poling, NP   25 mg at 11/11/19 2113  . magnesium hydroxide (MILK OF MAGNESIA) suspension 30 mL  30 mL Oral Daily PRN [name] A, NP      . spironolactone (ALDACTONE) tablet 100 mg  100 mg Oral Daily Alvaro Aungst, Worthy Rancher, MD   100 mg at 11/12/19 0802  . traZODone (DESYREL) tablet 50 mg  50 mg Oral QHS PRN [name] A, NP   50 mg at 11/11/19 2113  . [START ON 11/13/2019] venlafaxine XR (EFFEXOR-XR) 24 hr capsule 75 mg  75 mg Oral Q breakfast Whittley Carandang, Worthy Rancher, MD        Lab Results:  Results for orders placed or performed during the hospital encounter of 11/10/19 (from the past 48 hour(s))  CBC     Status: Abnormal   Collection Time: 11/11/19  5:49 PM  Result Value Ref Range   WBC 11.3 (H) 4.0 - 10.5 K/uL   RBC 5.26 4.22 - 5.81 MIL/uL   Hemoglobin 15.7 13.0 - 17.0 g/dL   HCT 09.4 39 - 52 %   MCV 92.4 80.0 - 100.0 fL   MCH 29.8 26.0 - 34.0 pg   MCHC 32.3 30.0 - 36.0 g/dL   RDW 70.9 62.8 - 36.6 %   Platelets 335 150 - 400 K/uL   nRBC 0.0 0.0 - 0.2 %    Comment: Performed at [facility], [address], [place], [place] [address]  Comprehensive metabolic panel     Status: Abnormal   Collection Time: 11/11/19  5:49 PM  Result Value Ref Range   Sodium 137 135 - 145 mmol/L   Potassium 4.1 3.5 - 5.1 mmol/L   Chloride 103 98 - 111 mmol/L   CO2 24 22 - 32 mmol/L   Glucose, Bld 91 70 - 99 mg/dL    Comment: Glucose reference range applies only to samples taken after fasting for at least 8 hours.   BUN 16 6 - 20 mg/dL   Creatinine, Ser 5.46 0.61 - 1.24 mg/dL   Calcium 9.2 8.9 - 50.3 mg/dL   Total Protein 8.3 (H) 6.5 - 8.1 g/dL   Albumin 4.2 3.5 - 5.0 g/dL   AST 19 15 - 41 U/L   ALT 24 0 - 44 U/L   Alkaline Phosphatase 77 38 - 126 U/L   Total Bilirubin 0.9 0.3 - 1.2 mg/dL   GFR, Estimated >54 >65 mL/min    Comment: (NOTE) Calculated using the CKD-EPI Creatinine Equation (2021)    Anion gap 10 5 - 15    Comment: Performed at  [facility], [address] [place], [place] [address]  Lipid panel     Status: Abnormal   Collection Time: 11/11/19  5:49 PM  Result Value Ref Range   Cholesterol 302 (H) 0 - 200 mg/dL   Triglycerides 604110 <540<150 mg/dL   HDL 43 >98>40 mg/dL   Total CHOL/HDL Ratio 7.0 RATIO   VLDL 22 0 - 40 mg/dL   LDL Cholesterol 119237 (H) 0 - 99 mg/dL    Comment:        Total Cholesterol/HDL:CHD Risk Coronary Heart Disease Risk Table                     Men   Women  1/2 Average Risk   3.4   3.3  Average Risk       5.0   4.4  2 X Average Risk   9.6   7.1  3 X Average Risk  23.4   11.0        Use the calculated Patient Ratio above and the CHD Risk Table to determine the patient's CHD Risk.        ATP III CLASSIFICATION (LDL):  <100     mg/dL   Optimal  147-829100-129  mg/dL   Near or Above                    Optimal  130-159  mg/dL   Borderline  562-130160-189  mg/dL   High  >865>190     mg/dL   Very High Performed at [facility], [address] [place], [place] [address]   TSH     Status: None   Collection Time: 11/11/19  5:49 PM  Result Value Ref Range   TSH 1.406 0.350 - 4.500 uIU/mL    Comment: Performed by a 3rd Generation assay with a functional sensitivity of <=0.01 uIU/mL. Performed at [facility], [address] [place], [place] [address]     Blood Alcohol level:  Lab Results  Component Value Date   ETH <5 08/06/2014   ETH <5 07/03/2014    Metabolic Disorder Labs: No results found for: HGBA1C, MPG No results found for: PROLACTIN Lab Results  Component Value Date   CHOL 302 (H) 11/11/2019   TRIG 110 11/11/2019   HDL 43 11/11/2019   CHOLHDL 7.0 11/11/2019   VLDL 22 11/11/2019   LDLCALC 237 (H) 11/11/2019    Physical Findings: AIMS:  , ,  ,  ,    CIWA:    COWS:      Musculoskeletal: Strength & Muscle Tone: within normal limits Gait & Station: normal Patient leans: N/A  Psychiatric Specialty Exam: Physical  Exam Constitutional:      Appearance: He is obese.  HENT:     Head: Normocephalic and atraumatic.  Musculoskeletal:     Cervical back: Normal range of motion.  Neurological:     Mental Status: He is alert and oriented to person, place, and time.     Review of Systems  All other systems reviewed and are negative.   Blood pressure 121/72, pulse 81, temperature 97.9 F (36.6 C), temperature source Oral, resp. rate 16, height 6\' 2"  (1.88 m), weight 130.2 kg, SpO2 100 %.Body mass index is 36.85 kg/m.  General Appearance: Disheveled  Eye Contact:  Good  Speech:  Clear and Coherent  Volume:  Normal  Mood:  Anxious and Depressed  Affect:  Appropriate, Congruent, Constricted and Depressed  Thought  Process:  Coherent  Orientation:  Full (Time, Place, and Person)  Thought Content:  Logical and Rumination  Suicidal Thoughts:  Yes.  without intent/plan  Homicidal Thoughts:  No  Memory:  Recent;   Fair  Judgement:  Impaired  Insight:  Present  Psychomotor Activity:  Normal  Concentration:  Concentration: Fair  Recall:  [employer] of Knowledge:  Fair  Language:  Fair  Akathisia:  No  Handed:  Right  AIMS (if indicated):     Assets:  Communication Skills Desire for Improvement Housing Physical Health Resilience Social Support Vocational/Educational  ADL's:  Intact  Cognition:  WNL  Sleep:  Number of Hours: 6    Treatment Plan Summary: Daily contact with patient to assess and evaluate symptoms and progress in treatment and Medication management    This is a 29 year old male to male transgender person who is presenting with major depressive disorder, recurrent. Patient will benefit from inpatient hospitalization for purposes of safety, stabilization, medication management. Patient does seem to have some identity issues contributing to depression. This is further worsened by significant social anxiety.  Plan: Increase Effexor 24-hour to 75 mg p.o. every morning Patient  continues to require inpatient hospitalization.   Clement Sayres, MD 11/12/2019, 4:17 PM

## 2019-11-12 NOTE — Progress Notes (Signed)
Pt visible in the dayroom some this evening. Pt stated he was doing a little better. Pt continues to present very depressed. 1:1 time spent with writer talking about various coping strategies    11/12/19 0000  Psych Admission Type (Psych Patients Only)  Admission Status Voluntary  Psychosocial Assessment  Patient Complaints Anxiety  Eye Contact Fair  Facial Expression Anxious;Sad  Affect Appropriate to circumstance  Speech Logical/coherent  Interaction Submissive;Guarded;Cautious  Motor Activity Slow  Appearance/Hygiene Unremarkable  Behavior Characteristics Cooperative  Mood Anxious  Thought Process  Coherency WDL  Content WDL  Delusions None reported or observed  Perception WDL  Hallucination None reported or observed  Judgment Poor  Confusion WDL  Danger to Self  Current suicidal ideation? Verbalizes  Self-Injurious Behavior No self-injurious ideation or behavior indicators observed or expressed   Agreement Not to Harm Self Yes  Description of Agreement Pt verbally contracts for safety  Danger to Others  Danger to Others None reported or observed

## 2019-11-12 NOTE — Tx Team (Signed)
Interdisciplinary Treatment and Diagnostic Plan Update  11/12/2019 Time of Session: 9:35am Shane Patrick MRN: [id number]  Principal Diagnosis: <principal problem not specified>  Secondary Diagnoses: Active Problems:   Severe recurrent major depression without psychotic features ([place])   Current Medications:  Current Facility-Administered Medications  Medication Dose Route Frequency Provider Last Rate Last Admin  . acetaminophen (TYLENOL) tablet 650 mg  650 mg Oral Q6H PRN Lindon Romp A, NP      . alum & mag hydroxide-simeth (MAALOX/MYLANTA) 200-200-20 MG/5ML suspension 30 mL  30 mL Oral Q4H PRN Lindon Romp A, NP      . estradiol (ESTRACE) tablet 2 mg  2 mg Oral Daily Cristofano, Dorene Ar, MD   2 mg at 11/12/19 0802  . hydrOXYzine (ATARAX/VISTARIL) tablet 25 mg  25 mg Oral TID PRN Rozetta Nunnery, NP   25 mg at 11/11/19 2113  . magnesium hydroxide (MILK OF MAGNESIA) suspension 30 mL  30 mL Oral Daily PRN Lindon Romp A, NP      . spironolactone (ALDACTONE) tablet 100 mg  100 mg Oral Daily Cristofano, Dorene Ar, MD   100 mg at 11/12/19 0802  . traZODone (DESYREL) tablet 50 mg  50 mg Oral QHS PRN Lindon Romp A, NP   50 mg at 11/11/19 2113  . venlafaxine XR (EFFEXOR-XR) 24 hr capsule 37.5 mg  37.5 mg Oral Q breakfast Cristofano, Dorene Ar, MD   37.5 mg at 11/12/19 0802   PTA Medications: Medications Prior to Admission  Medication Sig Dispense Refill Last Dose  . spironolactone (ALDACTONE) 100 MG tablet Take 100 mg by mouth daily.     Marland Kitchen estradiol (ESTRACE) 2 MG tablet Take 2 mg by mouth daily.       Patient Stressors: Other: chronic mental health, history of trauma  Patient Strengths: Ability for insight Average or above average intelligence Communication skills Physical Health Supportive family/friends  Treatment Modalities: Medication Management, Group therapy, Case management,  1 to 1 session with clinician, Psychoeducation, Recreational therapy.   Physician Treatment Plan for  Primary Diagnosis: <principal problem not specified> Long Term Goal(s): Improvement in symptoms so as ready for discharge   Short Term Goals: Ability to identify changes in lifestyle to reduce recurrence of condition will improve Ability to verbalize feelings will improve Ability to disclose and discuss suicidal ideas Ability to demonstrate self-control will improve Ability to identify and develop effective coping behaviors will improve Ability to maintain clinical measurements within normal limits will improve Compliance with prescribed medications will improve Ability to identify triggers associated with substance abuse/mental health issues will improve  Medication Management: Evaluate patient's response, side effects, and tolerance of medication regimen.  Therapeutic Interventions: 1 to 1 sessions, Unit Group sessions and Medication administration.  Evaluation of Outcomes: Progressing  Physician Treatment Plan for Secondary Diagnosis: Active Problems:   Severe recurrent major depression without psychotic features ([place])  Long Term Goal(s): Improvement in symptoms so as ready for discharge   Short Term Goals: Ability to identify changes in lifestyle to reduce recurrence of condition will improve Ability to verbalize feelings will improve Ability to disclose and discuss suicidal ideas Ability to demonstrate self-control will improve Ability to identify and develop effective coping behaviors will improve Ability to maintain clinical measurements within normal limits will improve Compliance with prescribed medications will improve Ability to identify triggers associated with substance abuse/mental health issues will improve     Medication Management: Evaluate patient's response, side effects, and tolerance of medication regimen.  Therapeutic Interventions: 1  to 1 sessions, Unit Group sessions and Medication administration.  Evaluation of Outcomes: Progressing   RN Treatment Plan  for Primary Diagnosis: <principal problem not specified> Long Term Goal(s): Knowledge of disease and therapeutic regimen to maintain health will improve  Short Term Goals: Ability to remain free from injury will improve, Ability to verbalize frustration and anger appropriately will improve, Ability to identify and develop effective coping behaviors will improve and Compliance with prescribed medications will improve  Medication Management: RN will administer medications as ordered by provider, will assess and evaluate patient's response and provide education to patient for prescribed medication. RN will report any adverse and/or side effects to prescribing provider.  Therapeutic Interventions: 1 on 1 counseling sessions, Psychoeducation, Medication administration, Evaluate responses to treatment, Monitor vital signs and CBGs as ordered, Perform/monitor CIWA, COWS, AIMS and Fall Risk screenings as ordered, Perform wound care treatments as ordered.  Evaluation of Outcomes: Not Met   LCSW Treatment Plan for Primary Diagnosis: <principal problem not specified> Long Term Goal(s): Safe transition to appropriate next level of care at discharge, Engage patient in therapeutic group addressing interpersonal concerns.  Short Term Goals: Engage patient in aftercare planning with referrals and resources, Increase social support, Identify triggers associated with mental health/substance abuse issues and Increase skills for wellness and recovery  Therapeutic Interventions: Assess for all discharge needs, 1 to 1 time with Social worker, Explore available resources and support systems, Assess for adequacy in community support network, Educate family and significant other(s) on suicide prevention, Complete Psychosocial Assessment, Interpersonal group therapy.  Evaluation of Outcomes: Not Met   Progress in Treatment: Attending groups: No. Participating in groups: No. Taking medication as prescribed:  Yes. Toleration medication: Yes. Family/Significant other contact made: Yes, individual(s) contacted:  grandmother Patient understands diagnosis: Yes. Discussing patient identified problems/goals with staff: Yes. Medical problems stabilized or resolved: Yes. Denies suicidal/homicidal ideation: Yes. Issues/concerns per patient self-inventory: No.   New problem(s) identified: No, Describe:  none  New Short Term/Long Term Goal(s): medication stabilization, elimination of SI thoughts, development of comprehensive mental wellness plan.   Patient Goals:  "To get back on medicine"  Discharge Plan or Barriers: Patient is to return to live with his parents. Patient is to follow up with current community providers at discharge.  Reason for Continuation of Hospitalization: Depression Medication stabilization Suicidal ideation  Estimated Length of Stay: 3-5 days  Attendees: Patient: Shane Patrick 11/12/2019  Physician:  11/12/2019   Nursing:  11/12/2019   RN Care Manager: 11/12/2019   Social Worker: Darletta Moll, LCSW 11/12/2019   Recreational Therapist:  11/12/2019  Other: Harriett Sine, NP 11/12/2019   Other:  11/12/2019  Other: 11/12/2019       Scribe for Treatment Team: Vassie Moselle, LCSW 11/12/2019 10:27 AM

## 2019-11-13 DIAGNOSIS — F332 Major depressive disorder, recurrent severe without psychotic features: Principal | ICD-10-CM

## 2019-11-13 NOTE — [facility] Group Notes (Signed)
LCSW Group Therapy Note  11/13/2019   10:00-11:00am   Type of Therapy and Topic:  Group Therapy: Anger Cues and Responses  Participation Level:  Active   Description of Group:   In this group, patients learned how to recognize the physical, cognitive, emotional, and behavioral responses they have to anger-provoking situations.  They identified a recent time they became angry and how they reacted.  They analyzed how their reaction was possibly beneficial and how it was possibly unhelpful.  The group discussed a variety of healthier coping skills that could help with such a situation in the future.  Focus was placed on how helpful it is to recognize the underlying emotions to our anger, because working on those can lead to a more permanent solution as well as our ability to focus on the important rather than the urgent.  Therapeutic Goals: 1. Patients will remember their last incident of anger and how they felt emotionally and physically, what their thoughts were at the time, and how they behaved. 2. Patients will identify how their behavior at that time worked for them, as well as how it worked against them. 3. Patients will explore possible new behaviors to use in future anger situations. 4. Patients will learn that anger itself is normal and cannot be eliminated, and that healthier reactions can assist with resolving conflict rather than worsening situations.  Summary of Patient Progress:  The patient shared that his most recent time of anger was about 1 month ago and said he became irritated when he was trying to put a new radio in his car, but it wouldn't go in properly.  He eventually got very frustrated, jerked it out, and while feeling out of control he hammered it to pieces.  He stated that he then had to put the regular radio back into the dash.  He said that he did feel out of control when this happened, even used his fist to break the radio.  He was able to acknowledge this was an  unhealthy way of coping, could identify some other possibilities that may have been healthier.  Therapeutic Modalities:   Cognitive Behavioral Therapy  Lynnell Chad

## 2019-11-13 NOTE — Progress Notes (Signed)
[facility]  11/13/2019 2:50 PM Billey ChangCody J Dulski  MRN:  [id number]  Principal Problem: <principal problem not specified> Diagnosis: Active Problems:   Severe recurrent major depression without psychotic features (HCC)  Total Time spent with patient: 20 minutes   Subjective: Shane Patrick stated " I have been here since Thursday how long do I have to stay?  I am starting to get home sick."  Evaluation: Shane Patrick was observed resting in bed. Shane Patrick is an  29 year old male to male transgender. (Male pronouns preferred) she is awake, alert and oriented x3.  Denying suicidal or homicidal ideations.  Denies auditory or visual hallucinations.  Reports " I am feeling a lot better today thinking that that felt in a while."  Patient reports she  is hopeful to discharge home.  Patient reports medication adjustment yesterday.  Reported taking Effexor 75 mg and tolerating medications well.  She is rating her depression 4 out of 10 with 10 being the worst during this assessment.  Reported mild anxiety symptoms upon awakening however, she stated her symptoms resolved with hydroxyzine.  Shane Patrick reported she started seeing her therapist regularly 1 week prior to admission.  Reports a good appetite.  States she is resting well throughout the night. support, encouragement and reassurance was provided.    Past Medical History:  Past Medical History:  Diagnosis Date  . ADHD (attention deficit hyperactivity disorder)   . Anxiety   . Depression     Past Surgical History:  Procedure Laterality Date  . ADENOIDECTOMY    . TONSILLECTOMY     Family History: History reviewed. No pertinent family history.  Social History:  Social History   Substance and Sexual Activity  Alcohol Use Not Currently   Comment: socially     Social History   Substance and Sexual Activity  Drug Use Not Currently   Comment: a few weeks ago    Social History   Socioeconomic History  . Marital status: Single    Spouse name: Not on file   . Number of children: Not on file  . Years of education: Not on file  . Highest education level: Not on file  Occupational History  . Not on file  Tobacco Use  . Smoking status: Never Smoker  . Smokeless tobacco: Never Used  Vaping Use  . Vaping Use: Never used  Substance and Sexual Activity  . Alcohol use: Not Currently    Comment: socially  . Drug use: Not Currently    Comment: a few weeks ago  . Sexual activity: Not Currently  Other Topics Concern  . Not on file  Social History Narrative  . Not on file   Social Determinants of Health   Financial Resource Strain:   . Difficulty of Paying Living Expenses: Not on file  Food Insecurity:   . Worried About Programme researcher, broadcasting/film/videounning Out of Food in the Last Year: Not on file  . Ran Out of Food in the Last Year: Not on file  Transportation Needs:   . Lack of Transportation (Medical): Not on file  . Lack of Transportation (Non-Medical): Not on file  Physical Activity:   . Days of Exercise per Week: Not on file  . Minutes of Exercise per Session: Not on file  Stress:   . Feeling of Stress : Not on file  Social Connections:   . Frequency of Communication with Friends and Family: Not on file  . Frequency of Social Gatherings with Friends and Family: Not on file  . Attends  Religious Services: Not on file  . Active Member of Clubs or Organizations: Not on file  . Attends Banker Meetings: Not on file  . 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                   Sleep: Good  Appetite:  Good  Current Medications: Current Facility-Administered Medications  Medication Dose Route Frequency Provider Last Rate Last Admin  . acetaminophen (TYLENOL) tablet 650 mg  650 mg Oral Q6H PRN [name] A, NP      . alum & mag hydroxide-simeth (MAALOX/MYLANTA) 200-200-20 MG/5ML suspension 30 mL  30 mL Oral Q4H PRN [name] A, NP      . estradiol (ESTRACE) tablet 2 mg  2 mg Oral Daily Cristofano, Worthy Rancher, MD   2 mg at 11/13/19 0749  . hydrOXYzine (ATARAX/VISTARIL) tablet 25 mg  25 mg Oral TID PRN Jackelyn Poling, NP   25 mg at 11/13/19 0753  . magnesium hydroxide (MILK OF MAGNESIA) suspension 30 mL  30 mL Oral Daily PRN [name] A, NP      . spironolactone (ALDACTONE) tablet 100 mg  100 mg Oral Daily Cristofano, Worthy Rancher, MD   100 mg at 11/12/19 0802  . traZODone (DESYREL) tablet 50 mg  50 mg Oral QHS PRN [name] A, NP   50 mg at 11/12/19 2117  . venlafaxine XR (EFFEXOR-XR) 24 hr capsule 75 mg  75 mg Oral Q breakfast Cristofano, Worthy Rancher, MD   75 mg at 11/13/19 0749    Lab Results:  Results for orders placed or performed during the hospital encounter of 11/10/19 (from the past 48 hour(s))  CBC     Status: Abnormal   Collection Time: 11/11/19  5:49 PM  Result Value Ref Range   WBC 11.3 (H) 4.0 - 10.5 K/uL   RBC 5.26 4.22 - 5.81 MIL/uL   Hemoglobin 15.7 13.0 - 17.0 g/dL   HCT 73.4 39 - 52 %   MCV 92.4 80.0 - 100.0 fL   MCH 29.8 26.0 - 34.0 pg   MCHC 32.3 30.0 - 36.0 g/dL   RDW 19.3 79.0 - 24.0 %   Platelets 335 150 - 400 K/uL   nRBC 0.0 0.0 - 0.2 %    Comment: Performed at [facility], [address], [place], [place] [address]  Comprehensive metabolic panel     Status: Abnormal   Collection Time: 11/11/19  5:49 PM  Result Value Ref Range   Sodium 137 135 - 145 mmol/L   Potassium 4.1 3.5 - 5.1 mmol/L   Chloride 103 98 - 111 mmol/L   CO2 24 22 - 32 mmol/L   Glucose, Bld 91 70 - 99 mg/dL    Comment: Glucose  reference range applies only to samples taken after fasting for at least 8 hours.   BUN 16 6 - 20 mg/dL   Creatinine, Ser 2.99 0.61 - 1.24 mg/dL  Calcium 9.2 8.9 - 10.3 mg/dL   Total Protein 8.3 (H) 6.5 - 8.1 g/dL   Albumin 4.2 3.5 - 5.0 g/dL   AST 19 15 - 41 U/L   ALT 24 0 - 44 U/L   Alkaline Phosphatase 77 38 - 126 U/L   Total Bilirubin 0.9 0.3 - 1.2 mg/dL   GFR, Estimated >45 >80 mL/min    Comment: (NOTE) Calculated using the CKD-EPI Creatinine Equation (2021)    Anion gap 10 5 - 15    Comment: Performed at [facility], [address], [place], [place] [address]  Lipid panel     Status: Abnormal   Collection Time: 11/11/19  5:49 PM  Result Value Ref Range   Cholesterol 302 (H) 0 - 200 mg/dL   Triglycerides 825 <053 mg/dL   HDL 43 >97 mg/dL   Total CHOL/HDL Ratio 7.0 RATIO   VLDL 22 0 - 40 mg/dL   LDL Cholesterol 673 (H) 0 - 99 mg/dL    Comment:        Total Cholesterol/HDL:CHD Risk Coronary Heart Disease Risk Table                     Men   Women  1/2 Average Risk   3.4   3.3  Average Risk       5.0   4.4  2 X Average Risk   9.6   7.1  3 X Average Risk  23.4   11.0        Use the calculated Patient Ratio above and the CHD Risk Table to determine the patient's CHD Risk.        ATP III CLASSIFICATION (LDL):  <100     mg/dL   Optimal  419-379  mg/dL   Near or Above                    Optimal  130-159  mg/dL   Borderline  024-097  mg/dL   High  >353     mg/dL   Very High Performed at [facility], [address] [place], [place] [address]   TSH     Status: None   Collection Time: 11/11/19  5:49 PM  Result Value Ref Range   TSH 1.406 0.350 - 4.500 uIU/mL    Comment: Performed by a 3rd Generation assay with a functional sensitivity of <=0.01 uIU/mL. Performed at [facility], [address], [place], [place] [address]   Urine rapid drug screen (hosp performed)not at [facility]     Status: None   Collection Time:  11/12/19  5:32 PM  Result Value Ref Range   Opiates NONE DETECTED NONE DETECTED   Cocaine NONE DETECTED NONE DETECTED   Benzodiazepines NONE DETECTED NONE DETECTED   Amphetamines NONE DETECTED NONE DETECTED   Tetrahydrocannabinol NONE DETECTED NONE DETECTED   Barbiturates NONE DETECTED NONE DETECTED    Comment: (NOTE) DRUG SCREEN FOR MEDICAL PURPOSES ONLY.  IF CONFIRMATION IS NEEDED FOR ANY PURPOSE, NOTIFY LAB WITHIN 5 DAYS.  LOWEST DETECTABLE LIMITS FOR URINE DRUG SCREEN Drug Class                     Cutoff (ng/mL) Amphetamine and metabolites    1000 Barbiturate and metabolites    200 Benzodiazepine                 200 Tricyclics and metabolites     300 Opiates and  metabolites        300 Cocaine and metabolites        300 THC                            50 Performed at [facility], [address] [place], [place] [address]     Blood Alcohol level:  Lab Results  Component Value Date   ETH <5 08/06/2014   ETH <5 07/03/2014    Metabolic Disorder Labs: No results found for: HGBA1C, MPG No results found for: PROLACTIN Lab Results  Component Value Date   CHOL 302 (H) 11/11/2019   TRIG 110 11/11/2019   HDL 43 11/11/2019   CHOLHDL 7.0 11/11/2019   VLDL 22 11/11/2019   LDLCALC 237 (H) 11/11/2019    Physical Findings: AIMS:  , ,  ,  ,    CIWA:    COWS:      Musculoskeletal: Strength & Muscle Tone: within normal limits Gait & Station: normal Patient leans: N/A  Psychiatric Specialty Exam: Physical Exam Constitutional:      Appearance: He is obese.  HENT:     Head: Normocephalic and atraumatic.  Musculoskeletal:     Cervical back: Normal range of motion.  Neurological:     Mental Status: He is alert and oriented to person, place, and time.     Review of Systems  All other systems reviewed and are negative.   Blood pressure 123/74, pulse 77, temperature 98.2 F (36.8 C), temperature source Oral, resp. rate 16, height 6\' 2"  (1.88 m),  weight 130.2 kg, SpO2 100 %.Body mass index is 36.85 kg/m.  General Appearance: Casual  Eye Contact:  Good  Speech:  Clear and Coherent  Volume:  Normal  Mood:  Anxious and Depressed  Affect:  Appropriate and Congruent  Thought Process:  Coherent  Orientation:  Full (Time, Place, and Person)  Thought Content:  Logical and Hallucinations: None  Suicidal Thoughts:  Yes.  without intent/plan  Homicidal Thoughts:  No  Memory:  Recent;   Fair  Judgement:  Impaired  Insight:  Present  Psychomotor Activity:  Normal  Concentration:  Concentration: Fair  Recall:  of Knowledge:  Fair  Language:  Fair  Akathisia:  No  Handed:  Right  AIMS (if indicated):     Assets:  Communication Skills Desire for Improvement Social Support Vocational/Educational  ADL's:  Intact  Cognition:  WNL  Sleep:  Number of Hours: 6.75    Treatment Plan Summary: Daily contact with patient to assess and evaluate symptoms and progress in treatment and Medication management   Treatment plan was reviewed and agreed upon by NP [employer] and patient Camille Thau need for continued inpatient admission on 11/13/2019   Medication management:  Continue Effexor 75 mg p.o. daily for depression/anxiety Continue hydroxyzine 25 mg p.o. as needed 3 times daily for anxiety Continue trazodone 50 mg p.o. nightly as needed for sleep   CSW to continue working on discharge disposition Patient encouraged to participate within the therapeutic milieu  11/15/2019, NP 11/13/2019, 2:50 PM

## 2019-11-13 NOTE — Progress Notes (Signed)
[place] NOVEL CORONAVIRUS (COVID-19) DAILY CHECK-OFF SYMPTOMS - answer yes or no to each - every day NO YES  Have you had a fever in the past 24 hours?  . Fever (Temp > 37.80C / 100F) X   Have you had any of these symptoms in the past 24 hours? . New Cough .  Sore Throat  .  Shortness of Breath .  Difficulty Breathing .  Unexplained Body Aches   X   Have you had any one of these symptoms in the past 24 hours not related to allergies?   . Runny Nose .  Nasal Congestion .  Sneezing   X   If you have had runny nose, nasal congestion, sneezing in the past 24 hours, has it worsened?  X   EXPOSURES - check yes or no X   Have you traveled outside the state in the past 14 days?  X   Have you been in contact with someone with a confirmed diagnosis of COVID-19 or PUI in the past 14 days without wearing appropriate PPE?  X   Have you been living in the same home as a person with confirmed diagnosis of COVID-19 or a PUI (household contact)?    X   Have you been diagnosed with COVID-19?    X              What to do next: Answered NO to all: Answered YES to anything:   Proceed with unit schedule Follow the [facility] Inpatient Flowsheet.   

## 2019-11-13 NOTE — Progress Notes (Addendum)
   11/13/19 1400  Psych Admission Type (Psych Patients Only)  Admission Status Voluntary  Psychosocial Assessment  Patient Complaints Anxiety;Depression  Eye Contact Fair  Facial Expression Anxious;Sad  Affect Appropriate to circumstance  Speech Logical/coherent  Interaction Guarded  Motor Activity Slow  Appearance/Hygiene Unremarkable  Behavior Characteristics Cooperative  Mood Anxious;Pleasant  Thought Process  Coherency WDL  Content WDL  Delusions None reported or observed  Perception WDL  Hallucination None reported or observed  Judgment Limited  Confusion WDL  Danger to Self  Current suicidal ideation? Denies  Self-Injurious Behavior No self-injurious ideation or behavior indicators observed or expressed   Agreement Not to Harm Self Yes  Description of Agreement Pt verbally contracts for safety  Danger to Others  Danger to Others None reported or observed    Per pt's self inventory, pt rated their depression, hopelessness and anxiety a 0/0/2, respectively. Pt wrote that their goal today is "be my best self", and wrote that they will "get out of my shell come to group". Pt currently denies SI/HI and A/VH

## 2019-11-13 NOTE — Progress Notes (Signed)
Pt visible in the milieu, observed seated in the dayroom watching TV. Took his sleep medications before going to bed. Pt went to bed later and has been a sleep though out, will continue to monitor.

## 2019-11-14 ENCOUNTER — Encounter (HOSPITAL_COMMUNITY): Payer: Self-pay | Admitting: Nurse Practitioner

## 2019-11-14 DIAGNOSIS — F332 Major depressive disorder, recurrent severe without psychotic features: Secondary | ICD-10-CM | POA: Diagnosis not present

## 2019-11-14 MED ORDER — HYDROXYZINE HCL 25 MG PO TABS: 25.0000 mg | ORAL_TABLET | Freq: Three times a day (TID) | ORAL | 0 refills | Status: AC | PRN

## 2019-11-14 MED ORDER — VENLAFAXINE HCL ER 75 MG PO CP24: 75.0000 mg | ORAL_CAPSULE | Freq: Every day | ORAL | 0 refills | Status: AC

## 2019-11-14 MED ORDER — TRAZODONE HCL 50 MG PO TABS: 50.0000 mg | ORAL_TABLET | Freq: Every evening | ORAL | 0 refills | Status: AC | PRN

## 2019-11-14 NOTE — Progress Notes (Signed)
  [facility] Adult Case Management Discharge Plan :  Will you be returning to the same living situation after discharge:  Yes,  with parents At discharge, do you have transportation home?: Yes,  Family members Do you have the ability to pay for your medications: Yes,  Medicaid  Release of information consent forms completed and emailed to Medical Records, then turned in to Medical Records by CSW.   Patient to Follow up at:  Follow-up Information    [facility] Follow up on 12/07/2019.   Why: Appointment is scheduled for 12/07/19 at 9:30am at [address] [place], [place] [address] Contact information: [address] [facility] [address] [place], [address]  Phone [phone] Fax: [phone]               Next level of care provider has access to [facility] Link:no  Safety Planning and Suicide Prevention discussed: Yes,  with grandmother  Have you used any form of tobacco in the last 30 days? (Cigarettes, Smokeless Tobacco, Cigars, and/or Pipes): No  Has patient been referred to the Quitline?: N/A patient is not a smoker  Patient has been referred for addiction treatment: N/A  Lynnell Chad, LCSW 11/14/2019, 9:23 AM

## 2019-11-14 NOTE — [facility] Suicide Risk Assessment (Signed)
[facility] Discharge Suicide Risk Assessment   Principal Problem: <principal problem not specified> Discharge Diagnoses: Active Problems:   Severe recurrent major depression without psychotic features (HCC)   Total Time spent with patient: 15 minutes  Musculoskeletal: Strength & Muscle Tone: within normal limits Gait & Station: normal Patient leans: N/A  Psychiatric Specialty Exam: Review of Systems  All other systems reviewed and are negative.   Blood pressure 124/78, pulse 82, temperature 98.1 F (36.7 C), temperature source Oral, resp. rate 16, height 6\' 2"  (1.88 m), weight 130.2 kg, SpO2 100 %.Body mass index is 36.85 kg/m.  General Appearance: Casual  Eye Contact::  Good  Speech:  Normal Rate409  Volume:  Normal  Mood:  Euthymic  Affect:  Congruent  Thought Process:  Coherent and Descriptions of Associations: Intact  Orientation:  Full (Time, Place, and Person)  Thought Content:  Logical  Suicidal Thoughts:  No  Homicidal Thoughts:  No  Memory:  Immediate;   Good Recent;   Good Remote;   Good  Judgement:  Intact  Insight:  Fair  Psychomotor Activity:  Normal  Concentration:  Good  Recall:  Good  Fund of Knowledge:Good  Language: Good  Akathisia:  Negative  Handed:  Right  AIMS (if indicated):     Assets:  Desire for Improvement Housing Resilience Social Support Talents/Skills Vocational/Educational  Sleep:  Number of Hours: 6.75  Cognition: WNL  ADL's:  Intact   Mental Status Per Nursing Assessment::   On Admission:  Suicidal ideation indicated by patient  Demographic Factors:  Caucasian and Gay, lesbian, or bisexual orientation  Loss Factors: NA  Historical Factors: Impulsivity  Risk Reduction Factors:   Living with another person, especially a relative, Positive social support and Positive coping skills or problem solving skills  Continued Clinical Symptoms:  Depression:   Impulsivity  Cognitive Features That Contribute To Risk:  None     Suicide Risk:  Minimal: No identifiable suicidal ideation.  Patients presenting with no risk factors but with morbid ruminations; may be classified as minimal risk based on the severity of the depressive symptoms   Follow-up Information    [facility] Follow up on 12/07/2019.   Why: Appointment is scheduled for 12/07/19 at 9:30am at [address] [place], [place] Contact information: [address] [facility] [address], [place]  Phone [phone] Fax: [phone]               Plan Of Care/Follow-up recommendations:  Activity:  ad lib  [phone], MD 11/14/2019, 9:31 AM

## 2019-11-14 NOTE — [facility] Group Notes (Addendum)
[facility] LCSW Group Therapy Note  Date/Time:  11/14/2019 9:00-10:00 or 10:00-11:00AM  Type of Therapy and Topic:  Group Therapy:  Healthy and Unhealthy Supports  Participation Level:  Active   Description of Group:  Patients in this group were introduced to the idea of adding a variety of healthy supports to address the various needs in their lives.Patients discussed what additional healthy supports could be helpful in their recovery and wellness after discharge in order to prevent future hospitalizations.   An emphasis was placed on using counselor, doctor, therapy groups, 12-step groups, and problem-specific support groups to expand supports.  Several songs were played to emphasize points made throughout group.  Therapeutic Goals:   1)  discuss importance of adding supports to stay well once out of the hospital  2)  compare healthy versus unhealthy supports and identify some examples of each  3)  generate ideas and descriptions of healthy supports that can be added  4)  offer mutual support about how to address unhealthy supports  5)  encourage active participation in and adherence to discharge plan    Summary of Patient Progress:  The patient stated that current healthy supports in his life are his one friend, and in some ways his parents/family members although in some ways they cannot be helpful or supportive because they think he is "just going through a phase" and will snap out of it when he decides to.  Therefore, the family would also be his current unhealthy supports. The patient expressed a willingness to take his parents with him to some of his therapy sessions to learn more about his illness as support(s) to help in his recovery journey.   Therapeutic Modalities:   Motivational Interviewing Brief Solution-Focused Therapy  Ambrose Mantle, LCSW

## 2019-11-14 NOTE — Progress Notes (Addendum)
   11/14/19 0900  Psych Admission Type (Psych Patients Only)  Admission Status Voluntary  Psychosocial Assessment  Patient Complaints None  Eye Contact Fair  Facial Expression Anxious  Affect Appropriate to circumstance  Speech Logical/coherent  Interaction Guarded  Motor Activity Slow  Appearance/Hygiene Unremarkable  Behavior Characteristics Cooperative  Mood Pleasant  Thought Process  Coherency WDL  Content WDL  Delusions None reported or observed  Perception WDL  Hallucination None reported or observed  Judgment Limited  Confusion WDL  Danger to Self  Current suicidal ideation? Denies  Self-Injurious Behavior No self-injurious ideation or behavior indicators observed or expressed   Agreement Not to Harm Self Yes  Description of Agreement Pt verbally contracts for safety  Danger to Others  Danger to Others None reported or observed    D. Pt presents as friendly- exhibits calm and cooperative behavior, and has been visible in the dayroom attending group-Pt voices no complaints other than some lower back pain (rated a 6/10) that patient believes may be from the hospital bed. Per pt's self inventory, pt rated their depression, hopelessness and anxiety all 0's. Pt wrote that their goal today is "working on myself making a great day".  Pt currently denies SI/HI and AVH A. Labs and vitals monitored. Pt administered scheduled medications, and given Tylenol and heat pack for back pain.  Pt supported emotionally and encouraged to express concerns and ask questions.   R. Pt remains safe with 15 minute checks. Will continue POC.

## 2019-11-14 NOTE — Discharge Summary (Signed)
Physician Discharge Summary Note  Patient:  Shane Patrick is an 29 y.o., adult MRN:  [id number] DOB:  1990/03/26 Patient phone:  [phone] (home)  Patient address:   [address] [place] [address],  Total Time spent with patient: 30 minutes  Date of Admission:  11/10/2019 Date of Discharge: 11/14/19   Reason for Admission:   Shane Patrick is a 29-yr. old patient who presented to ED with complaints worsening depression and suicidal ideation.    Principal Problem: Severe recurrent major depression without psychotic features [facility]) Discharge Diagnoses: Principal Problem:   Severe recurrent major depression without psychotic features [facility])   Past Psychiatric History: Patient has a past psychiatric history notable 2 prior inpatient hospitalizations in the year 2016. Patient said at that time he had attempted to end his life and was feeling depressed.   Past Medical History:  Past Medical History:  Diagnosis Date  . ADHD (attention deficit hyperactivity disorder)   . Anxiety   . Depression     Past Surgical History:  Procedure Laterality Date  . ADENOIDECTOMY    . TONSILLECTOMY     Family History: History reviewed. No pertinent family history. Family Psychiatric  History: Unaware Social History:  Social History   Substance and Sexual Activity  Alcohol Use Not Currently   Comment: socially     Social History   Substance and Sexual Activity  Drug Use Not Currently   Comment: a few weeks ago    Social History   Socioeconomic History  . Marital status: Single    Spouse name: Not on file  . Number of children: Not on file  . Years of education: Not on file  . Highest education level: Not on file  Occupational History  . Not on file  Tobacco Use  . Smoking status: Never Smoker  . Smokeless tobacco: Never Used  Vaping Use  . Vaping Use: Never used  Substance and Sexual Activity  . Alcohol use: Not Currently    Comment: socially  . Drug use: Not  Currently    Comment: a few weeks ago  . Sexual activity: Not Currently  Other Topics Concern  . Not on file  Social History Narrative  . Not on file   Social Determinants of Health   Financial Resource Strain:   . Difficulty of Paying Living Expenses: Not on file  Food Insecurity:   . Worried About Programme researcher, broadcasting/film/video in the Last Year: Not on file  . Ran Out of Food in the Last Year: Not on file  Transportation Needs:   . Lack of Transportation (Medical): Not on file  . Lack of Transportation (Non-Medical): Not on file  Physical Activity:   . Days of Exercise per Week: Not on file  . Minutes of Exercise per Session: Not on file  Stress:   . Feeling of Stress : Not on file  Social Connections:   . Frequency of Communication with Friends and Family: Not on file  . Frequency of Social Gatherings with Friends and Family: Not on file  . Attends Religious Services: Not on file  . Active Member of Clubs or Organizations: Not on file  . Attends Banker Meetings: Not on file  . Marital Status: Not on file    Hospital Course:  [name] was admitted for Severe recurrent major depression without psychotic features (HCC) and crisis management.  He was treated with the following medications Vistaril for anxiety, Trazodone for sleep, and  Effexor for major depression.  Medications tolerated well and no reported adverse reactions.  Shane Patrick was discharged with current medication and was instructed on how to take medications as prescribed; (details listed below under Medication List).  Medical problems were identified and treated as needed.  Home medications were restarted as appropriate.  Improvement was monitored by observation and Billey Chang daily report of symptom reduction.  Emotional and mental status was monitored by daily self-inventory reports completed by Billey Chang and clinical staff.         Shane Patrick was evaluated by the treatment team for stability and  plans for continued recovery upon discharge.  [name] motivation was an integral factor for scheduling further treatment.  Employment, transportation, bed availability, health status, family support, and any pending legal issues were also considered during his hospital stay.  He was offered further treatment options upon discharge including but not limited to Residential, Intensive Outpatient, and Outpatient treatment.  Shane Patrick will follow up with the services as listed below under Follow Up Information.     Upon completion of this admission the Shane Patrick was both mentally and medically stable for discharge denying suicidal/homicidal ideation, auditory/visual/tactile hallucinations, delusional thoughts and paranoia.     Results for orders placed or performed during the hospital encounter of 11/10/19 (from the past 72 hour(s))  CBC     Status: Abnormal   Collection Time: 11/11/19  5:49 PM  Result Value Ref Range   WBC 11.3 (H) 4.0 - 10.5 K/uL   RBC 5.26 4.22 - 5.81 MIL/uL   Hemoglobin 15.7 13.0 - 17.0 g/dL   HCT 55.7 39 - 52 %   MCV 92.4 80.0 - 100.0 fL   MCH 29.8 26.0 - 34.0 pg   MCHC 32.3 30.0 - 36.0 g/dL   RDW 32.2 02.5 - 42.7 %   Platelets 335 150 - 400 K/uL   nRBC 0.0 0.0 - 0.2 %    Comment: Performed at [facility], [address] [place], [place] [address]  Comprehensive metabolic panel     Status: Abnormal   Collection Time: 11/11/19  5:49 PM  Result Value Ref Range   Sodium 137 135 - 145 mmol/L   Potassium 4.1 3.5 - 5.1 mmol/L   Chloride 103 98 - 111 mmol/L   CO2 24 22 - 32 mmol/L   Glucose, Bld 91 70 - 99 mg/dL    Comment: Glucose reference range applies only to samples taken after fasting for at least 8 hours.   BUN 16 6 - 20 mg/dL   Creatinine, Ser 6.28 0.61 - 1.24 mg/dL   Calcium 9.2 8.9 - 31.5 mg/dL   Total Protein 8.3 (H) 6.5 - 8.1 g/dL   Albumin 4.2 3.5 - 5.0 g/dL   AST 19 15 - 41 U/L   ALT 24 0 - 44 U/L   Alkaline Phosphatase 77 38  - 126 U/L   Total Bilirubin 0.9 0.3 - 1.2 mg/dL   GFR, Estimated >17 >61 mL/min    Comment: (NOTE) Calculated using the CKD-EPI Creatinine Equation (2021)    Anion gap 10 5 - 15    Comment: Performed at [facility], [address] [place], [place] [address]  Lipid panel     Status: Abnormal   Collection Time: 11/11/19  5:49 PM  Result Value Ref Range   Cholesterol 302 (H) 0 - 200 mg/dL   Triglycerides 106 <269 mg/dL  HDL 43 >40 mg/dL   Total CHOL/HDL Ratio 7.0 RATIO   VLDL 22 0 - 40 mg/dL   LDL Cholesterol 161237 (H) 0 - 99 mg/dL    Comment:        Total Cholesterol/HDL:CHD Risk Coronary Heart Disease Risk Table                     Men   Women  1/2 Average Risk   3.4   3.3  Average Risk       5.0   4.4  2 X Average Risk   9.6   7.1  3 X Average Risk  23.4   11.0        Use the calculated Patient Ratio above and the CHD Risk Table to determine the patient's CHD Risk.        ATP III CLASSIFICATION (LDL):  <100     mg/dL   Optimal  [id number]  mg/dL   Near or Above                    Optimal  130-159  mg/dL   Borderline  [id number]  mg/dL   High  >914>190     mg/dL   Very High Performed at [facility], [address] [place], [place] [address]   TSH     Status: None   Collection Time: 11/11/19  5:49 PM  Result Value Ref Range   TSH 1.406 0.350 - 4.500 uIU/mL    Comment: Performed by a 3rd Generation assay with a functional sensitivity of <=0.01 uIU/mL. Performed at [facility], [address] [place], [place] [address]   Urine rapid drug screen (hosp performed)not at [facility]     Status: None   Collection Time: 11/12/19  5:32 PM  Result Value Ref Range   Opiates NONE DETECTED NONE DETECTED   Cocaine NONE DETECTED NONE DETECTED   Benzodiazepines NONE DETECTED NONE DETECTED   Amphetamines NONE DETECTED NONE DETECTED   Tetrahydrocannabinol NONE DETECTED NONE DETECTED   Barbiturates NONE DETECTED NONE DETECTED    Comment:  (NOTE) DRUG SCREEN FOR MEDICAL PURPOSES ONLY.  IF CONFIRMATION IS NEEDED FOR ANY PURPOSE, NOTIFY LAB WITHIN 5 DAYS.  LOWEST DETECTABLE LIMITS FOR URINE DRUG SCREEN Drug Class                     Cutoff (ng/mL) Amphetamine and metabolites    1000 Barbiturate and metabolites    200 Benzodiazepine                 200 Tricyclics and metabolites     300 Opiates and metabolites        300 Cocaine and metabolites        300 THC                            50 Performed at [facility], [address] [place], [place] [address]      Physical Findings: AIMS:  , ,  ,  ,    CIWA:    COWS:     Musculoskeletal: Strength & Muscle Tone: within normal limits Gait & Station: normal Patient leans: N/A  Psychiatric Specialty Exam: Physical Exam Vitals and nursing note reviewed.  Constitutional:      Appearance: Normal appearance.  HENT:     Head: Normocephalic.  Pulmonary:     Effort: Pulmonary effort  is normal.  Musculoskeletal:        General: Normal range of motion.  Neurological:     Mental Status: He is alert.     Review of Systems  Psychiatric/Behavioral: Negative for agitation, behavioral problems, hallucinations, self-injury and suicidal ideas. Dysphoric mood: stable. The patient is not hyperactive (Stable).   All other systems reviewed and are negative.   Blood pressure 124/78, pulse 82, temperature 98.1 F (36.7 C), temperature source Oral, resp. rate 16, height 6\' 2"  (1.88 m), weight 130.2 kg, SpO2 100 %.Body mass index is 36.85 kg/m.   General Appearance: Casual  Eye Contact::  Good  Speech:  Normal   Volume:  Normal  Mood:  Euthymic  Affect:  Congruent  Thought Process:  Coherent and Descriptions of Associations: Intact  Orientation:  Full (Time, Place, and Person)  Thought Content:  Logical  Suicidal Thoughts:  No  Homicidal Thoughts:  No  Memory:  Immediate;   Good Recent;   Good Remote;   Good  Judgement:  Intact  Insight:  Fair   Psychomotor Activity:  Normal  Concentration:  Good  Recall:  Good  Fund of Knowledge:Good  Language: Good  Akathisia:  Negative  Handed:  Right  AIMS (if indicated):     Assets:  Desire for Improvement Housing Resilience Social Support Talents/Skills Vocational/Educational  Sleep:  Number of Hours: 6.75  Cognition: WNL  ADL's:  Intact    Have you used any form of tobacco in the last 30 days? (Cigarettes, Smokeless Tobacco, Cigars, and/or Pipes): No  Has this patient used any form of tobacco in the last 30 days? (Cigarettes, Smokeless Tobacco, Cigars, and/or Pipes) Yes, No  Blood Alcohol level:  Lab Results  Component Value Date   ETH <5 08/06/2014   ETH <5 07/03/2014    Metabolic Disorder Labs:  No results found for: HGBA1C, MPG No results found for: PROLACTIN Lab Results  Component Value Date   CHOL 302 (H) 11/11/2019   TRIG 110 11/11/2019   HDL 43 11/11/2019   CHOLHDL 7.0 11/11/2019   VLDL 22 11/11/2019   LDLCALC 237 (H) 11/11/2019    See Psychiatric Specialty Exam and Suicide Risk Assessment completed by Attending Physician prior to discharge.  Discharge destination:  Home  Is patient on multiple antipsychotic therapies at discharge:  No   Has Patient had three or more failed trials of antipsychotic monotherapy by history:  No  Recommended Plan for Multiple Antipsychotic Therapies: NA  Discharge Instructions    Diet - low sodium heart healthy   Complete by: As directed    Increase activity slowly   Complete by: As directed      Allergies as of 11/14/2019      Reactions   Azithromycin Rash   Ceclor [cefaclor] Other (See Comments)   UNKNOWN CHILDHOOD ALLERGY   Erythromycin Other (See Comments)   UNKNOWN CHILDHOOD ALLERGY   Penicillins Rash   .Did it involve swelling of the face/tongue/throat, SOB, or low BP? No Did it involve sudden or severe rash/hives, skin peeling, or any reaction on the inside of your mouth or nose? Yes Did you need to seek  medical attention at a hospital or doctor's office? Yes When did it last happen?Childhood If all above answers are "NO", may proceed with cephalosporin use.      Medication List    TAKE these medications     Indication  estradiol 2 MG tablet Commonly known as: ESTRACE Take 2 mg by mouth daily.  Indication:  Endocrine and metabolic   hydrOXYzine 25 MG tablet Commonly known as: ATARAX/VISTARIL Take 1 tablet (25 mg total) by mouth 3 (three) times daily as needed for anxiety.  Indication: Feeling Anxious   spironolactone 100 MG tablet Commonly known as: ALDACTONE Take 100 mg by mouth daily.  Indication: Transition   traZODone 50 MG tablet Commonly known as: DESYREL Take 1 tablet (50 mg total) by mouth at bedtime as needed for sleep.  Indication: Trouble Sleeping   venlafaxine XR 75 MG 24 hr capsule Commonly known as: EFFEXOR-XR Take 1 capsule (75 mg total) by mouth daily with breakfast. Start taking on: November 15, 2019  Indication: Generalized Anxiety Disorder, Major Depressive Disorder       Follow-up Information    [facility] Follow up on 12/07/2019.   Why: Appointment is scheduled for 12/07/19 at 9:30am at [address] [place], [place] [address] Contact information: [address] [facility] [address] [place], [address]  Phone [phone] Fax: [phone]               Follow-up recommendations:  Activity:  As tolerated Diet:  Heart healthy  Comments:  HAIDEN RAWLINSON has been instructed to take medications as prescribed; and report adverse effects to outpatient provider.  Follow up with primary doctor for any medical issues and If symptoms recur report to nearest emergency or crisis hot line.    SignedAssunta Found, NP 11/14/2019, 10:02 AM

## 2019-11-14 NOTE — Progress Notes (Signed)
[facility] NOVEL CORONAVIRUS (COVID-19) DAILY CHECK-OFF SYMPTOMS - answer yes or no to each - every day NO YES  Have you had a fever in the past 24 hours?  . Fever (Temp > 37.80C / 100F) X   Have you had any of these symptoms in the past 24 hours? . New Cough .  Sore Throat  .  Shortness of Breath .  Difficulty Breathing .  Unexplained Body Aches   X   Have you had any one of these symptoms in the past 24 hours not related to allergies?   . Runny Nose .  Nasal Congestion .  Sneezing   X   If you have had runny nose, nasal congestion, sneezing in the past 24 hours, has it worsened?  X   EXPOSURES - check yes or no X   Have you traveled outside the state in the past 14 days?  X   Have you been in contact with someone with a confirmed diagnosis of COVID-19 or PUI in the past 14 days without wearing appropriate PPE?  X   Have you been living in the same home as a person with confirmed diagnosis of COVID-19 or a PUI (household contact)?    X   Have you been diagnosed with COVID-19?    X              What to do next: Answered NO to all: Answered YES to anything:   Proceed with unit schedule Follow the [facility] Inpatient Flowsheet.   

## 2019-11-14 NOTE — Progress Notes (Signed)
Pt reporting a better day, attended the activities during the day and enjoyed. Pt requested for sleep medication before going to bed , went to bed and has been resting with even and unlabored respiration, will continue to monitor.

## 2019-11-14 NOTE — Progress Notes (Signed)
Discharge Note:  Patient denies SI/HI at this time. Discharge instructions, AVS, prescriptions gone over with patient. Patient agrees to comply with medication management, follow-up visit, and outpatient therapy. Patient questions and concerns addressed and answered. Patient discharged to home with grandmother. No signs of distress noted at the time of discharge.

## 2020-03-13 IMAGING — CR DG CHEST 1V PORT
1 series · 1 of 1 positions shown · non-contrast
Comparison: Chest x-ray 10/13/2016

CLINICAL DATA: Cough, headaches and sore throat since [REDACTED].

EXAM:
PORTABLE CHEST 1 VIEW

[portable]
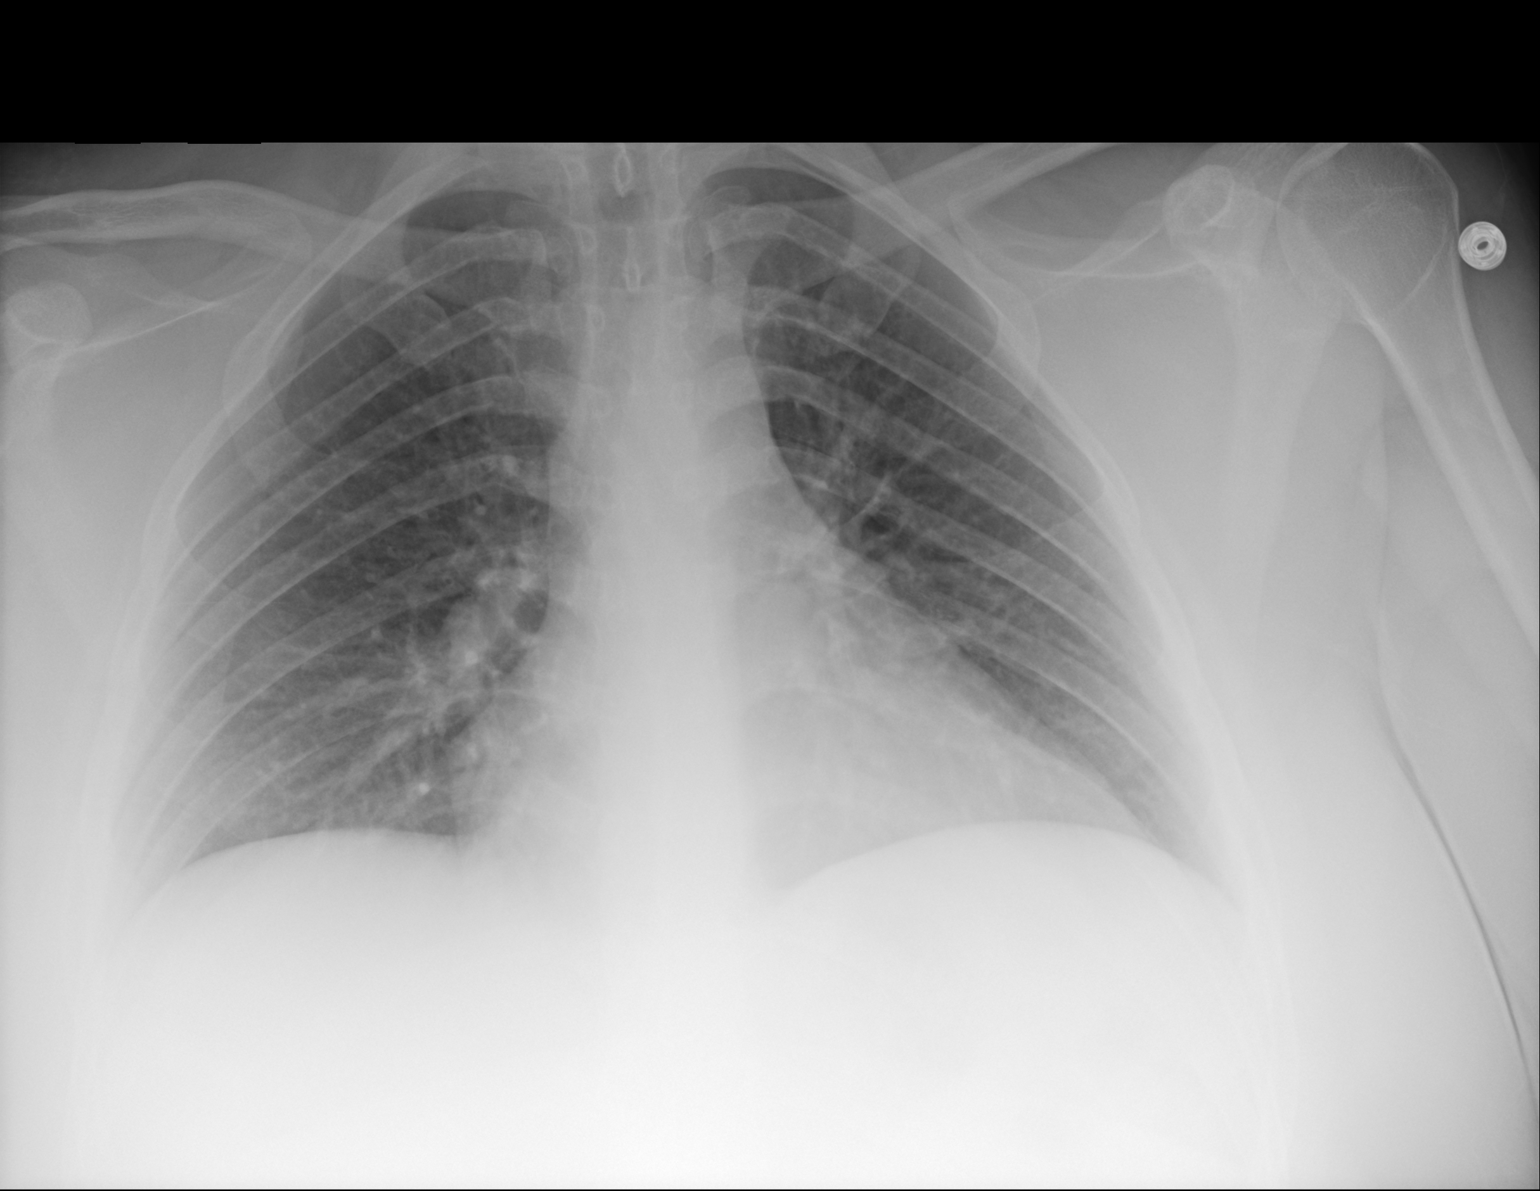

[1 of 1 positions shown; findings below may reference images not displayed]

FINDINGS: The cardiac silhouette, mediastinal and hilar contours are within
normal limits given the AP projection. The lungs are clear. No
pleural effusions. The bony thorax is intact.
IMPRESSION: No acute cardiopulmonary findings.

## 2020-11-08 ENCOUNTER — Other Ambulatory Visit: Payer: Self-pay

## 2020-11-08 ENCOUNTER — Emergency Department (HOSPITAL_COMMUNITY)
Admission: EM | Admit: 2020-11-08 | Discharge: 2020-11-08 | Disposition: A | Discharge status: Home / Self Care | Payer: Self-pay | Attending: Emergency Medicine | Admitting: Emergency Medicine

## 2020-11-08 ENCOUNTER — Encounter (HOSPITAL_COMMUNITY): Payer: Self-pay

## 2020-11-08 DIAGNOSIS — S060X0A Concussion without loss of consciousness, initial encounter: Secondary | ICD-10-CM | POA: Insufficient documentation

## 2020-11-08 DIAGNOSIS — S161XXA Strain of muscle, fascia and tendon at neck level, initial encounter: Secondary | ICD-10-CM | POA: Insufficient documentation

## 2020-11-08 DIAGNOSIS — Y92410 Unspecified street and highway as the place of occurrence of the external cause: Secondary | ICD-10-CM | POA: Insufficient documentation

## 2020-11-08 DIAGNOSIS — R1030 Lower abdominal pain, unspecified: Secondary | ICD-10-CM | POA: Insufficient documentation

## 2020-11-08 MED ORDER — METHOCARBAMOL 500 MG PO TABS: 500.0000 mg | ORAL_TABLET | Freq: Two times a day (BID) | ORAL | 0 refills | Status: AC | PRN

## 2020-11-08 MED ORDER — NAPROXEN 500 MG PO TABS: 500.0000 mg | ORAL_TABLET | Freq: Two times a day (BID) | ORAL | 0 refills | Status: DC
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
## 2020-11-08 NOTE — Discharge Instructions (Signed)
Please take Naprosyn, 500mg  by mouth twice daily as needed for pain - this in an antiinflammatory medicine (NSAID) and is similar to ibuprofen - many people feel that it is stronger than ibuprofen and it is easier to take since it is a smaller pill.  Please use this only for 1 week - if your pain persists, you will need to follow up with your doctor in the office for ongoing guidance and pain control.    Please take Robaxin, 500 mg up to twice a day as needed for muscle spasm, this is a muscle relaxer, it may cause generalized weakness, sleepiness and you should not drive or do important things while taking this medication.  Thank you for letting take care of you today!  Please obtain all of your results from medical records or have your doctors office obtain the results - share them with your doctor - you should be seen at your doctors office in the next 2 days. Call today to arrange your follow up. Take the medications as prescribed. Please review all of the medicines and only take them if you do not have an allergy to them. Please be aware that if you are taking birth control pills, taking other prescriptions, ESPECIALLY ANTIBIOTICS may make the birth control ineffective - if this is the case, either do not engage in sexual activity or use alternative methods of birth control such as condoms until you have finished the medicine and your family doctor says it is OK to restart them. If you are on a blood thinner such as COUMADIN, be aware that any other medicine that you take may cause the coumadin to either work too much, or not enough - you should have your coumadin level rechecked in next 7 days if this is the case.  ?  It is also a possibility that you have an allergic reaction to any of the medicines that you have been prescribed - Everybody reacts differently to medications and while MOST people have no trouble with most medicines, you may have a reaction such as nausea, vomiting, rash, swelling,  shortness of breath. If this is the case, please stop taking the medicine immediately and contact your physician.   If you were given a medication in the ED such as percocet, vicodin, or morphine, be aware that these medicines are sedating and may change your ability to take care of yourself adequately for several hours after being given this medicines - you should not drive or take care of small children if you were given this medicine in the Emergency Department or if you have been prescribed these types of medicines. ?   You should return to the ER IMMEDIATELY if you develop severe or worsening symptoms.

## 2022-01-14 ENCOUNTER — Encounter (HOSPITAL_BASED_OUTPATIENT_CLINIC_OR_DEPARTMENT_OTHER): Payer: Self-pay

## 2022-01-14 ENCOUNTER — Other Ambulatory Visit: Payer: Self-pay

## 2022-01-14 ENCOUNTER — Emergency Department (HOSPITAL_BASED_OUTPATIENT_CLINIC_OR_DEPARTMENT_OTHER)
Admission: EM | Admit: 2022-01-14 | Discharge: 2022-01-14 | Disposition: A | Discharge status: Home / Self Care | Payer: Self-pay | Attending: Emergency Medicine | Admitting: Emergency Medicine

## 2022-01-14 DIAGNOSIS — R110 Nausea: Secondary | ICD-10-CM

## 2022-01-14 DIAGNOSIS — J101 Influenza due to other identified influenza virus with other respiratory manifestations: Secondary | ICD-10-CM | POA: Insufficient documentation

## 2022-01-14 DIAGNOSIS — Z1152 Encounter for screening for COVID-19: Secondary | ICD-10-CM | POA: Insufficient documentation

## 2022-01-14 LAB — RESP PANEL BY RT-PCR (RSV, FLU A&B, COVID)  RVPGX2
Influenza A by PCR: NEGATIVE
Influenza B by PCR: POSITIVE — AB
Resp Syncytial Virus by PCR: NEGATIVE
SARS Coronavirus 2 by RT PCR: NEGATIVE

## 2022-01-14 MED ORDER — BENZONATATE 100 MG PO CAPS
200.0000 mg | ORAL_CAPSULE | Freq: Once | ORAL | Status: AC
  Administered 2022-01-14: 200 mg via ORAL
  Filled 2022-01-14: qty 2

## 2022-01-14 MED ORDER — ONDANSETRON 4 MG PO TBDP: 4.0000 mg | ORAL_TABLET | Freq: Three times a day (TID) | ORAL | 0 refills | Status: DC | PRN

## 2022-01-14 MED ORDER — ONDANSETRON 4 MG PO TBDP
8.0000 mg | ORAL_TABLET | Freq: Once | ORAL | Status: AC
  Administered 2022-01-14: 8 mg via ORAL
  Filled 2022-01-14: qty 2

## 2022-01-14 NOTE — ED Provider Notes (Signed)
 [facility] EMERGENCY DEPT Provider Note   CSN: [id number] Arrival date & time: 01/14/22  1804     History Chief Complaint  Patient presents with   Generalized Body Aches    Shane Patrick is a 31 y.o. adult.  HPI Patient presents to the emergency department complaints of generalized bodyaches for the last several days.  Patient denies any known sick contacts at home or close contacts.  Complaining of generalized bodyaches, headaches, subjective fevers, nausea, vomiting.  Patient reports the vomiting is pretty mild.  Has just been present over the last day but feels that this is usually due to severity of nausea that if you get under control he would generally feel better.    Home Medications Prior to Admission medications   Medication Sig Start Date End Date Taking? Authorizing Provider  ondansetron (ZOFRAN-ODT) 4 MG disintegrating tablet Take 1 tablet (4 mg total) by mouth every 8 (eight) hours as needed for nausea or vomiting. 01/14/22  Yes Smitty Knudsen, PA-C  estradiol (ESTRACE) 2 MG tablet Take 2 mg by mouth daily. 07/28/17   [provider]  hydrOXYzine (ATARAX/VISTARIL) 25 MG tablet Take 1 tablet (25 mg total) by mouth 3 (three) times daily as needed for anxiety. 11/14/19   Rankin, Shuvon B, NP  methocarbamol (ROBAXIN) 500 MG tablet Take 1 tablet (500 mg total) by mouth 2 (two) times daily as needed for muscle spasms. 11/08/20   Eber Hong, MD  naproxen (NAPROSYN) 500 MG tablet Take 1 tablet (500 mg total) by mouth 2 (two) times daily with a meal. 11/08/20   Eber Hong, MD  spironolactone (ALDACTONE) 100 MG tablet Take 100 mg by mouth daily.    [provider]  traZODone (DESYREL) 50 MG tablet Take 1 tablet (50 mg total) by mouth at bedtime as needed for sleep. 11/14/19   Rankin, Shuvon B, NP  venlafaxine XR (EFFEXOR-XR) 75 MG 24 hr capsule Take 1 capsule (75 mg total) by mouth daily with breakfast. 11/15/19   Rankin, Shuvon B, NP       Allergies    Azithromycin, Ceclor [cefaclor], Erythromycin, and Penicillins    Review of Systems   Review of Systems  Constitutional:  Positive for appetite change and chills. Negative for fever.  Respiratory:  Positive for cough and chest tightness.   Cardiovascular:  Negative for chest pain.  Gastrointestinal:  Positive for nausea and vomiting.  Musculoskeletal:  Positive for myalgias.  All other systems reviewed and are negative.   Physical Exam Updated Vital Signs BP (!) 131/96 (BP Location: Right Arm)   Pulse 81   Temp 98 F (36.7 C) (Oral)   Resp 18   Ht 6\' 2"  (1.88 m)   Wt 132 kg   SpO2 97%   BMI 37.36 kg/m  Physical Exam Vitals and nursing note reviewed.  Constitutional:      Appearance: Normal appearance.  HENT:     Head: Normocephalic and atraumatic.  Eyes:     General: No scleral icterus.    Conjunctiva/sclera: Conjunctivae normal.  Cardiovascular:     Rate and Rhythm: Normal rate and regular rhythm.  Pulmonary:     Effort: Pulmonary effort is normal. No respiratory distress.     Breath sounds: Normal breath sounds. No stridor. No wheezing or rhonchi.  Abdominal:     General: Abdomen is flat.  Skin:    General: Skin is warm and dry.     Capillary Refill: Capillary refill takes less than 2 seconds.  Findings: No erythema or rash.  Neurological:     General: No focal deficit present.     Mental Status: He is alert.     ED Results / Procedures / Treatments   Labs (all labs ordered are listed, but only abnormal results are displayed) Labs Reviewed  RESP PANEL BY RT-PCR (RSV, FLU A&B, COVID)  RVPGX2 - Abnormal; Notable for the following components:      Result Value   Influenza B by PCR POSITIVE (*)    All other components within normal limits    EKG None  Radiology No results found.  Procedures Procedures   Medications Ordered in ED Medications  ondansetron (ZOFRAN-ODT) disintegrating tablet 8 mg (has no administration in time  range)    ED Course/ Medical Decision Making/ A&P                           Medical Decision Making Risk Prescription drug management.   This patient presents to the ED for concern of generalized bodyaches.  Differential diagnosis includes influenza, COVID-19, RSV, other viral URI, pneumonia, bronchitis.   Lab Tests:  I Ordered, and personally interpreted labs.  The pertinent results include: Influenza B positive   Medicines ordered and prescription drug management:  I ordered medication including Zofran for nausea Reevaluation of the patient after these medicines showed that the patient improved I have reviewed the patients home medicines and have made adjustments as needed   Problem List / ED Course:  Patient presented to the emergency department with complaints of generalized bodyaches.  Patient presentation viral testing was performed and patient was positive for influenza B.  Patient appeared to be in some level of discomfort but symptoms appear to be somewhat controlled with home over-the-counter medications.  He reports that he has had some episodes of emesis and has generally had a lack of appetite due to nausea.  Dose of Zofran was given to patient while he was still here in the emergency department with improved nausea symptoms.  Advised patient that a prescription for Zofran was also sent to his pharmacy that he can pick up to ensure that he is maintaining adequate hydration.  Plan to discharge home discussed with patient he was agreeable to this plan as well as all necessary return precautions.  No further questions at the end of this assessment.  Final Clinical Impression(s) / ED Diagnoses Final diagnoses:  Influenza B  Nausea    Rx / DC Orders ED Discharge Orders          Ordered    ondansetron (ZOFRAN-ODT) 4 MG disintegrating tablet  Every 8 hours PRN        01/14/22 2000              Smitty Knudsen, PA-C 01/15/22 1750    Melene Plan, DO 01/15/22  1805

## 2022-01-14 NOTE — Discharge Instructions (Signed)
You are seen in the emergency department today and diagnosed with influenza B.  Continue to manage symptoms at home with adequate hydration and Tylenol/Aleve for fevers and generalized bodyaches.  Prescription for Zofran has been sent to your pharmacy that you may take every few hours if needed for nausea to ensure that you are maintaining adequate hydration.  Please return to the emergency department if you have further concerns about symptoms or concern about severe dehydration.

## 2022-01-14 NOTE — ED Triage Notes (Signed)
Pt states fever and sweat since Thursday. Pt c/o some emesis and decreased appetite. Pt states productive cough.

## 2022-04-04 ENCOUNTER — Emergency Department (HOSPITAL_BASED_OUTPATIENT_CLINIC_OR_DEPARTMENT_OTHER)
Admission: EM | Admit: 2022-04-04 | Discharge: 2022-04-04 | Disposition: A | Discharge status: Home / Self Care | Payer: Self-pay | Attending: Emergency Medicine | Admitting: Emergency Medicine

## 2022-04-04 ENCOUNTER — Other Ambulatory Visit: Payer: Self-pay

## 2022-04-04 ENCOUNTER — Encounter (HOSPITAL_BASED_OUTPATIENT_CLINIC_OR_DEPARTMENT_OTHER): Payer: Self-pay | Admitting: Emergency Medicine

## 2022-04-04 DIAGNOSIS — J020 Streptococcal pharyngitis: Secondary | ICD-10-CM | POA: Insufficient documentation

## 2022-04-04 DIAGNOSIS — Z20822 Contact with and (suspected) exposure to covid-19: Secondary | ICD-10-CM | POA: Insufficient documentation

## 2022-04-04 LAB — RESP PANEL BY RT-PCR (RSV, FLU A&B, COVID)  RVPGX2
Influenza A by PCR: NEGATIVE
Influenza B by PCR: NEGATIVE
Resp Syncytial Virus by PCR: NEGATIVE
SARS Coronavirus 2 by RT PCR: NEGATIVE

## 2022-04-04 LAB — GROUP A STREP BY PCR: Group A Strep by PCR: DETECTED — AB

## 2022-04-04 MED ORDER — ONDANSETRON 4 MG PO TBDP: 4.0000 mg | ORAL_TABLET | Freq: Three times a day (TID) | ORAL | 0 refills | Status: DC | PRN

## 2022-04-04 MED ORDER — DEXAMETHASONE SODIUM PHOSPHATE 10 MG/ML IJ SOLN
10.0000 mg | Freq: Once | INTRAMUSCULAR | Status: AC
  Administered 2022-04-04: 10 mg via INTRAMUSCULAR
  Filled 2022-04-04: qty 1

## 2022-04-04 MED ORDER — CLINDAMYCIN HCL 150 MG PO CAPS: 150.0000 mg | ORAL_CAPSULE | Freq: Three times a day (TID) | ORAL | 0 refills | Status: AC

## 2022-04-04 MED ORDER — CLINDAMYCIN HCL 150 MG PO CAPS: 150.0000 mg | ORAL_CAPSULE | Freq: Three times a day (TID) | ORAL | 0 refills | Status: DC
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
## 2022-04-04 NOTE — Discharge Instructions (Addendum)
You were seen for strep throat in the emergency department.   At home, please take Tylenol and ibuprofen for your pain.  Take the antibiotics we have prescribed you to treat the infection.    Check your MyChart online for the results of any tests that had not resulted by the time you left the emergency department.   Follow-up with your primary doctor in 2-3 days regarding your visit.    Return immediately to the emergency department if you experience any of the following: Difficulty breathing, severe pain, or any other concerning symptoms.    Thank you for visiting our Emergency Department. It was a pleasure taking care of you today.
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
## 2022-11-19 ENCOUNTER — Other Ambulatory Visit: Payer: Self-pay

## 2022-11-19 ENCOUNTER — Encounter (HOSPITAL_BASED_OUTPATIENT_CLINIC_OR_DEPARTMENT_OTHER): Payer: Self-pay | Admitting: *Deleted

## 2022-11-19 DIAGNOSIS — B349 Viral infection, unspecified: Secondary | ICD-10-CM | POA: Insufficient documentation

## 2022-11-19 DIAGNOSIS — Z20822 Contact with and (suspected) exposure to covid-19: Secondary | ICD-10-CM | POA: Insufficient documentation

## 2022-11-20 ENCOUNTER — Emergency Department (HOSPITAL_BASED_OUTPATIENT_CLINIC_OR_DEPARTMENT_OTHER)
Admission: EM | Admit: 2022-11-20 | Discharge: 2022-11-20 | Disposition: A | Discharge status: Home / Self Care | Payer: Self-pay | Attending: Emergency Medicine | Admitting: Emergency Medicine

## 2022-11-20 DIAGNOSIS — B349 Viral infection, unspecified: Secondary | ICD-10-CM

## 2022-11-20 LAB — SARS CORONAVIRUS 2 BY RT PCR: SARS Coronavirus 2 by RT PCR: NEGATIVE

## 2022-11-20 LAB — GROUP A STREP BY PCR: Group A Strep by PCR: NOT DETECTED

## 2022-11-20 MED ORDER — ONDANSETRON 4 MG PO TBDP: 4.0000 mg | ORAL_TABLET | Freq: Three times a day (TID) | ORAL | 0 refills | Status: AC | PRN

## 2022-11-20 MED ORDER — NAPROXEN 500 MG PO TABS: 500.0000 mg | ORAL_TABLET | Freq: Two times a day (BID) | ORAL | 0 refills | Status: AC

## 2022-11-20 NOTE — Discharge Instructions (Signed)
You were evaluated in the Emergency Department and after careful evaluation, we did not find any emergent condition requiring admission or further testing in the hospital.  Your exam/testing today is overall reassuring.  Symptoms likely due to a viral illness.  Use the Naprosyn twice daily as needed for discomfort.  Use Zofran as needed for nausea.  Plenty of fluids and rest.  Please return to the Emergency Department if you experience any worsening of your condition.   Thank you for allowing [place] to be a part of your care.

## 2022-11-20 NOTE — ED Notes (Signed)
 RN reviewed discharge instructions with pt. Pt verbalized understanding and had no further questions. VSS upon discharge.  

## 2022-11-20 NOTE — ED Provider Notes (Signed)
[facility]ERGENC[facility] Emergency Department Provider Note MRN:  [id number]  Arrival date & time: 11/20/22     Chief Complaint   Sore Throat   History of Present Illness   Shane Patrick is a 32 y.o. year-old adult with no pertinent past medical history presenting to the ED with chief complaint of sore throat.  Sore throat malaise and bodyaches for the past 3 days or so.  Mild cough, chills.  Review of Systems  A thorough review of systems was obtained and all systems are negative except as noted in the HPI and PMH.   Patient's Health History    Past Medical History:  Diagnosis Date   ADHD (attention deficit hyperactivity disorder)    Anxiety    Depression     Past Surgical History:  Procedure Laterality Date   ADENOIDECTOMY     TONSILLECTOMY      History reviewed. No pertinent family history.  Social History   Socioeconomic History   Marital status: Single    Spouse name: Not on file   Number of children: Not on file   Years of education: Not on file   Highest education level: Not on file  Occupational History   Not on file  Tobacco Use   Smoking status: Never   Smokeless tobacco: Never  Vaping Use   Vaping status: Never Used  Substance and Sexual Activity   Alcohol use: Not Currently    Comment: socially   Drug use: Not Currently    Comment: a few weeks ago   Sexual activity: Not Currently  Other Topics Concern   Not on file  Social History Narrative   Not on file   Social Determinants of Health   Financial Resource Strain: Not on file  Food Insecurity: Not on file  Transportation Needs: Not on file  Physical Activity: Not on file  Stress: Not on file  Social Connections: Unknown (06/03/2021)   Received from [facility], [facility]   Social Network    Social Network: Not on file  Intimate Partner Violence: Unknown (04/24/2021)   Received from [employer], [facility]   HITS    Physically Hurt: Not on file    Insult or Talk  Down To: Not on file    Threaten Physical Harm: Not on file    Scream or Curse: Not on file     Physical Exam   Vitals:   11/19/22 2342  BP: (!) 146/91  Pulse: 72  Resp: 16  Temp: 98.3 F (36.8 C)  SpO2: 98%    CONSTITUTIONAL: Well-appearing, NAD NEURO/PSYCH:  Alert and oriented x 3, no focal deficits EYES:  eyes equal and reactive ENT/NECK:  no LAD, no JVD CARDIO: Regular rate, well-perfused, normal S1 and S2 PULM:  CTAB no wheezing or rhonchi GI/GU:  non-distended, non-tender MSK/SPINE:  No gross deformities, no edema SKIN:  no rash, atraumatic   *Additional and/or pertinent findings included in MDM below  Diagnostic and Interventional Summary    EKG Interpretation Date/Time:    Ventricular Rate:    PR Interval:    QRS Duration:    QT Interval:    QTC Calculation:   R Axis:      Text Interpretation:         Labs Reviewed  SARS CORONAVIRUS 2 BY RT PCR  GROUP A STREP BY PCR    No orders to display    Medications - No data to display   Procedures  /  Critical  Care Procedures  ED Course and Medical Decision Making  Initial Impression and Ddx Well-appearing, no acute distress, vitals normal, no increased work of breathing, lungs clear, no meningismus.  Symptoms suggestive of viral illness, possibly COVID-19.  Mild erythema to the posterior oropharynx but no signs of abscess.  Strep throat considered as well.  Past medical/surgical history that increases complexity of ED encounter: None  Interpretation of Diagnostics COVID and strep negative  Patient Reassessment and Ultimate Disposition/Management     Discharge  Patient management required discussion with the following services or consulting groups:  None  Complexity of Problems Addressed Acute complicated illness or Injury  Additional Data Reviewed and Analyzed Further history obtained from: None  Additional Factors Impacting ED Encounter Risk Prescriptions  Elmer Sow. Pilar Plate, MD [facility]  Health Emergency Medicine [facility] [email or web address]  Final Clinical Impressions(s) / ED Diagnoses     ICD-10-CM   1. Viral illness  B34.9       ED Discharge Orders          Ordered    naproxen (NAPROSYN) 500 MG tablet  2 times daily        11/20/22 0244    ondansetron (ZOFRAN-ODT) 4 MG disintegrating tablet  Every 8 hours PRN        11/20/22 0244             Discharge Instructions Discussed with and Provided to Patient:    Discharge Instructions      You were evaluated in the Emergency Department and after careful evaluation, we did not find any emergent condition requiring admission or further testing in the hospital.  Your exam/testing today is overall reassuring.  Symptoms likely due to a viral illness.  Use the Naprosyn twice daily as needed for discomfort.  Use Zofran as needed for nausea.  Plenty of fluids and rest.  Please return to the Emergency Department if you experience any worsening of your condition.   Thank you for allowing [place] to be a part of your care.      Sabas Sous, MD 11/20/22 [phone]
